# Patient Record
Sex: Female | Born: 1980 | Race: White | Hispanic: No | Marital: Single | Smoking: Former smoker
Health system: Southern US, Community
[De-identification: ages and names within clinical notes are randomized; demographics above are authoritative.]

## PROBLEM LIST (undated history)

## (undated) DIAGNOSIS — F419 Anxiety disorder, unspecified: Secondary | ICD-10-CM

## (undated) DIAGNOSIS — F99 Mental disorder, not otherwise specified: Secondary | ICD-10-CM

## (undated) DIAGNOSIS — I10 Essential (primary) hypertension: Secondary | ICD-10-CM

## (undated) DIAGNOSIS — E282 Polycystic ovarian syndrome: Secondary | ICD-10-CM

## (undated) HISTORY — DX: Mental disorder, not otherwise specified: F99

## (undated) HISTORY — PX: AMPUTATION TOE: SHX6595

## (undated) HISTORY — DX: Polycystic ovarian syndrome: E28.2

---
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
## 2000-04-24 ENCOUNTER — Emergency Department (HOSPITAL_COMMUNITY): Admission: EM | Admit: 2000-04-24 | Discharge: 2000-04-24 | Payer: Self-pay | Admitting: Internal Medicine

## 2001-08-03 ENCOUNTER — Encounter: Payer: Self-pay | Admitting: Emergency Medicine

## 2001-08-03 ENCOUNTER — Emergency Department (HOSPITAL_COMMUNITY): Admission: EM | Admit: 2001-08-03 | Discharge: 2001-08-03 | Payer: Self-pay | Admitting: Emergency Medicine

## 2004-05-21 ENCOUNTER — Emergency Department (HOSPITAL_COMMUNITY): Admission: EM | Admit: 2004-05-21 | Discharge: 2004-05-21 | Payer: Self-pay | Admitting: Emergency Medicine

## 2005-10-28 ENCOUNTER — Emergency Department (HOSPITAL_COMMUNITY): Admission: EM | Admit: 2005-10-28 | Discharge: 2005-10-28 | Payer: Self-pay | Admitting: Emergency Medicine

## 2018-06-15 ENCOUNTER — Telehealth: Payer: Self-pay

## 2018-06-15 ENCOUNTER — Ambulatory Visit
Admission: EM | Admit: 2018-06-15 | Discharge: 2018-06-15 | Disposition: A | Discharge status: Home / Self Care | Payer: Self-pay

## 2018-06-15 DIAGNOSIS — R42 Dizziness and giddiness: Secondary | ICD-10-CM

## 2018-06-15 DIAGNOSIS — I10 Essential (primary) hypertension: Secondary | ICD-10-CM

## 2018-06-15 HISTORY — DX: Anxiety disorder, unspecified: F41.9

## 2018-06-15 HISTORY — DX: Essential (primary) hypertension: I10

## 2018-06-15 LAB — POCT FASTING CBG KUC MANUAL ENTRY: POCT Glucose (KUC): 125 mg/dL — AB (ref 70–99)

## 2018-06-15 MED ORDER — MECLIZINE HCL 25 MG PO TABS: 25.0000 mg | ORAL_TABLET | Freq: Three times a day (TID) | ORAL | 0 refills | Status: DC | PRN

## 2018-06-15 MED ORDER — MECLIZINE HCL 25 MG PO TABS: 25.0000 mg | ORAL_TABLET | Freq: Three times a day (TID) | ORAL | 0 refills | Status: AC | PRN

## 2018-06-15 MED ORDER — LABETALOL HCL 200 MG PO TABS: 200.0000 mg | ORAL_TABLET | Freq: Two times a day (BID) | ORAL | 0 refills | Status: DC

## 2018-06-15 MED ORDER — ESCITALOPRAM OXALATE 20 MG PO TABS: 20.0000 mg | ORAL_TABLET | Freq: Every day | ORAL | 0 refills | Status: DC

## 2018-06-15 NOTE — Discharge Instructions (Signed)
No alarming signs on exam. Start meclizine to help with dizziness/lightheaded with head movement. Restart lexapro. Keep hydrated, urine should be clear to pale yellow in color. Follow up with PCP for further evaluation needed. If experiencing worsening symptoms, chest pain, shortness of breath, passing out, go to the ED for further evaluation needed.

## 2018-06-15 NOTE — ED Triage Notes (Signed)
Pt states been out of her lexapro x2wks, states for the past week "having burst" of feeling light headed.

## 2018-06-15 NOTE — ED Triage Notes (Signed)
Pt states had a anxiety attack on the way here

## 2018-06-15 NOTE — ED Provider Notes (Signed)
[name]    CSN: [id number] Arrival date & time: 06/15/18  1330     History   Chief Complaint Chief Complaint  Patient presents with  . Medication Refill    HPI Teresa Harper is a 38 y.o. female.   38 year old female comes in for evaluation of intermittent lightheadedness.  Patient states for the past 1 to 2 weeks, has had intermittent lightheadedness/dizziness.  This is not associated with activity, positional changes, chest pain, shortness of breath, palpitation, weakness, syncope.  She states today, while driving, had an episode that seems to be worse than the rest, and therefore came in for evaluation.  States it could have been a panic attack as well, as she had hyperventilation, numbness tingling to the forehead, right arm that has now resolved.  She denies fever, chills, night sweats.  Denies URI symptoms such as cough, congestion, sore throat.  Denies one-sided weakness.  Denies history of diabetes, heart disease.  Family history of heart disease, with grandmother having MI, unsure age, but at least over 93.  She has also been off her Lexapro for the past 2 weeks.  She has been on Lexapro 20 mg for "a very long time".  Denies recent changes in dosage.  States was unable to get into primary care, and ran out of medication.     Past Medical History:  Diagnosis Date  . Anxiety   . Hypertension     There are no active problems to display for this patient.   History reviewed. No pertinent surgical history.  OB History   No obstetric history on file.      Home Medications    Prior to Admission medications   Medication Sig Start Date End Date Taking? Authorizing Provider  escitalopram (LEXAPRO) 20 MG tablet Take 1 tablet (20 mg total) by mouth daily. 06/15/18   Cathie Hoops, Amy V, PA-C  labetalol (NORMODYNE) 200 MG tablet Take 1 tablet (200 mg total) by mouth 2 (two) times daily for 30 days. 06/15/18 07/15/18  Belinda Fisher, PA-C  meclizine (ANTIVERT) 25 MG tablet Take 1  tablet (25 mg total) by mouth 3 (three) times daily as needed for dizziness. 06/15/18   Belinda Fisher, PA-C    Family History No family history on file.  Social History Social History   Tobacco Use  . Smoking status: Never Smoker  . Smokeless tobacco: Never Used  Substance Use Topics  . Alcohol use: Yes  . Drug use: Not on file     Allergies   Patient has no known allergies.   Review of Systems Review of Systems  Reason unable to perform ROS: See HPI as above.     Physical Exam Triage Vital Signs ED Triage Vitals  Enc Vitals Group     BP 06/15/18 1350 (!) 146/91     Pulse Rate 06/15/18 1350 78     Resp 06/15/18 1350 20     Temp 06/15/18 1350 98 F (36.7 C)     Temp Source 06/15/18 1350 Oral     SpO2 06/15/18 1350 97 %     Weight --      Height --      Head Circumference --      Peak Flow --      Pain Score 06/15/18 1351 0     Pain Loc --      Pain Edu? --      Excl. in GC? --    No data found.  Updated  Vital Signs BP (!) 146/91 (BP Location: Left Arm)   Pulse 78   Temp 98 F (36.7 C) (Oral)   Resp 20   SpO2 97%   Visual Acuity Right Eye Distance:   Left Eye Distance:   Bilateral Distance:    Right Eye Near:   Left Eye Near:    Bilateral Near:     Physical Exam Constitutional:      General: She is not in acute distress.    Appearance: Normal appearance. She is well-developed. She is not ill-appearing, toxic-appearing or diaphoretic.  HENT:     Head: Normocephalic and atraumatic.     Mouth/Throat:     Mouth: Mucous membranes are moist.     Pharynx: Oropharynx is clear. Uvula midline.  Eyes:     Extraocular Movements: Extraocular movements intact.     Conjunctiva/sclera: Conjunctivae normal.     Pupils: Pupils are equal, round, and reactive to light.  Neck:     Musculoskeletal: Normal range of motion and neck supple.  Cardiovascular:     Rate and Rhythm: Normal rate and regular rhythm.     Heart sounds: Normal heart sounds. No murmur. No  friction rub. No gallop.   Pulmonary:     Effort: Pulmonary effort is normal. No accessory muscle usage, prolonged expiration, respiratory distress or retractions.     Comments: Lungs clear to auscultation without adventitious lung sounds. Skin:    General: Skin is warm and dry.  Neurological:     General: No focal deficit present.     Mental Status: She is alert and oriented to person, place, and time.     GCS: GCS eye subscore is 4. GCS verbal subscore is 5. GCS motor subscore is 6.     Cranial Nerves: Cranial nerves are intact.     Sensory: Sensation is intact.     Motor: Motor function is intact.     Coordination: Coordination is intact.     Gait: Gait is intact.     Comments: Patient able to ambulate on without difficulty.    UC Treatments / Results  Labs (all labs ordered are listed, but only abnormal results are displayed) Labs Reviewed  POCT FASTING CBG [facility] MANUAL ENTRY - Abnormal; Notable for the following components:      Result Value   POCT Glucose ([facility]) 125 (*)    All other components within normal limits    EKG None  Radiology No results found.  Procedures Procedures (including critical care time)  Medications Ordered in UC Medications - No data to display  Initial Impression / Assessment and Plan / UC Course  I have reviewed the triage vital signs and the nursing notes.  Pertinent labs & imaging results that were available during my care of the patient were reviewed by me and considered in my medical decision making (see chart for details).    Patient experiences some dizziness with eye or neck movement.  Otherwise normal exam.  Discussed could be withdrawal from Lexapro, will restart Lexapro as directed.  Will provide meclizine for possible vertigo causing lightheadedness/dizziness.  Reassurance provided.  Will have patient follow-up with PCP for further evaluation and management needed.  Return precautions given.  After discharge, patient requested  refill of labetalol as she has still been unable to get in with primary care, and only has 2 days left of medication.  Will refill 30 days of medication.  Final Clinical Impressions(s) / UC Diagnoses   Final diagnoses:  Episodic lightheadedness  ED Prescriptions    Medication Sig Dispense Auth. Provider   escitalopram (LEXAPRO) 20 MG tablet Take 1 tablet (20 mg total) by mouth daily. 30 tablet Yu, Amy V, PA-C   meclizine (ANTIVERT) 25 MG tablet Take 1 tablet (25 mg total) by mouth 3 (three) times daily as needed for dizziness. 15 tablet Yu, Amy V, PA-C   labetalol (NORMODYNE) 200 MG tablet Take 1 tablet (200 mg total) by mouth 2 (two) times daily for 30 days. 60 tablet Threasa AlphaYu, Amy V, PA-C       Yu, Amy V, [place] 06/15/18 1540

## 2020-03-12 ENCOUNTER — Emergency Department (HOSPITAL_BASED_OUTPATIENT_CLINIC_OR_DEPARTMENT_OTHER)
Admission: EM | Admit: 2020-03-12 | Discharge: 2020-03-12 | Disposition: A | Discharge status: Home / Self Care | Payer: Self-pay | Attending: Emergency Medicine | Admitting: Emergency Medicine

## 2020-03-12 ENCOUNTER — Other Ambulatory Visit: Payer: Self-pay

## 2020-03-12 ENCOUNTER — Emergency Department (HOSPITAL_BASED_OUTPATIENT_CLINIC_OR_DEPARTMENT_OTHER): Payer: Self-pay

## 2020-03-12 ENCOUNTER — Encounter (HOSPITAL_BASED_OUTPATIENT_CLINIC_OR_DEPARTMENT_OTHER): Payer: Self-pay | Admitting: Emergency Medicine

## 2020-03-12 DIAGNOSIS — O23599 Infection of other part of genital tract in pregnancy, unspecified trimester: Secondary | ICD-10-CM | POA: Insufficient documentation

## 2020-03-12 DIAGNOSIS — I10 Essential (primary) hypertension: Secondary | ICD-10-CM | POA: Insufficient documentation

## 2020-03-12 DIAGNOSIS — Z3A00 Weeks of gestation of pregnancy not specified: Secondary | ICD-10-CM | POA: Insufficient documentation

## 2020-03-12 DIAGNOSIS — N939 Abnormal uterine and vaginal bleeding, unspecified: Secondary | ICD-10-CM

## 2020-03-12 DIAGNOSIS — O034 Incomplete spontaneous abortion without complication: Secondary | ICD-10-CM | POA: Insufficient documentation

## 2020-03-12 DIAGNOSIS — B9689 Other specified bacterial agents as the cause of diseases classified elsewhere: Secondary | ICD-10-CM

## 2020-03-12 LAB — COMPREHENSIVE METABOLIC PANEL
ALT: 12 U/L (ref 0–44)
AST: 16 U/L (ref 15–41)
Albumin: 4.1 g/dL (ref 3.5–5.0)
Alkaline Phosphatase: 38 U/L (ref 38–126)
Anion gap: 9 (ref 5–15)
BUN: 7 mg/dL (ref 6–20)
CO2: 25 mmol/L (ref 22–32)
Calcium: 9.5 mg/dL (ref 8.9–10.3)
Chloride: 102 mmol/L (ref 98–111)
Creatinine, Ser: 0.63 mg/dL (ref 0.44–1.00)
GFR, Estimated: >60 mL/min (ref >60)
Glucose, Bld: 93 mg/dL (ref 70–99)
Potassium: 3.5 mmol/L (ref 3.5–5.1)
Sodium: 136 mmol/L (ref 135–145)
Total Bilirubin: 0.5 mg/dL (ref 0.3–1.2)
Total Protein: 7.2 g/dL (ref 6.5–8.1)

## 2020-03-12 LAB — URINALYSIS, ROUTINE W REFLEX MICROSCOPIC
Bilirubin Urine: NEGATIVE
Glucose, UA: NEGATIVE mg/dL
Ketones, ur: NEGATIVE mg/dL
Leukocytes,Ua: NEGATIVE
Nitrite: NEGATIVE
Protein, ur: NEGATIVE mg/dL
Specific Gravity, Urine: 1.02 (ref 1.005–1.030)
pH: 5 (ref 5.0–8.0)

## 2020-03-12 LAB — CBC WITH DIFFERENTIAL/PLATELET
Abs Immature Granulocytes: 0.07 10*3/uL (ref 0.00–0.07)
Basophils Absolute: 0.1 10*3/uL (ref 0.0–0.1)
Basophils Relative: 1 %
Eosinophils Absolute: 0.2 10*3/uL (ref 0.0–0.5)
Eosinophils Relative: 1 %
HCT: 38 % (ref 36.0–46.0)
Hemoglobin: 13.3 g/dL (ref 12.0–15.0)
Immature Granulocytes: 1 %
Lymphocytes Relative: 34 %
Lymphs Abs: 3.7 10*3/uL (ref 0.7–4.0)
MCH: 30 pg (ref 26.0–34.0)
MCHC: 35 g/dL (ref 30.0–36.0)
MCV: 85.6 fL (ref 80.0–100.0)
Monocytes Absolute: 0.9 10*3/uL (ref 0.1–1.0)
Monocytes Relative: 8 %
Neutro Abs: 6.2 10*3/uL (ref 1.7–7.7)
Neutrophils Relative %: 55 %
Platelets: 242 10*3/uL (ref 150–400)
RBC: 4.44 MIL/uL (ref 3.87–5.11)
RDW: 12.2 % (ref 11.5–15.5)
WBC: 11.1 10*3/uL — ABNORMAL HIGH (ref 4.0–10.5)
nRBC: 0 % (ref 0.0–0.2)

## 2020-03-12 LAB — ABO/RH: ABO/RH(D): A POS

## 2020-03-12 LAB — HCG, QUANTITATIVE, PREGNANCY: hCG, Beta Chain, Quant, S: 5972 m[IU]/mL — ABNORMAL HIGH (ref <5)

## 2020-03-12 LAB — WET PREP, GENITAL
Sperm: NONE SEEN
Trich, Wet Prep: NONE SEEN
Yeast Wet Prep HPF POC: NONE SEEN

## 2020-03-12 LAB — PREGNANCY, URINE: Preg Test, Ur: POSITIVE — AB

## 2020-03-12 LAB — URINALYSIS, MICROSCOPIC (REFLEX)

## 2020-03-12 MED ORDER — METRONIDAZOLE 500 MG PO TABS: 500.0000 mg | ORAL_TABLET | Freq: Two times a day (BID) | ORAL | 0 refills | Status: AC

## 2020-03-12 NOTE — ED Notes (Signed)
Patient transported to Ultrasound 

## 2020-03-12 NOTE — ED Provider Notes (Signed)
Emergency Department Provider Note   I have reviewed the triage vital signs and the nursing notes.   HISTORY  Chief Complaint Vaginal Bleeding   HPI Teresa Harper is a 40 y.o. female with past medical history of anxiety and hypertension presents to the emergency department with vaginal bleeding in the setting of at home positive pregnancy test.  Patient has never been pregnant previously.  She tested positive for pregnancy 3 times at home.  She states that she had sex on Friday with some faint spotting at that time.  She has had several instances in the past 24 hours with some passage of small clots.  She notes that today she has been wearing a pad with minimal bleeding.  She notes sometimes she will have some pink-tinged material with wiping.  Denies any dysuria, hesitancy, urgency.  She has called to establish care with an OB/GYN but appointments are several weeks away at least and she was advised to present to the ED with vaginal bleeding.  She is not having abdominal pain.  She does describe a "fullness" in the lower abdomen. No prior pregnancy or miscarriage.    Past Medical History:  Diagnosis Date  . Anxiety   . Hypertension   . Mental disorder     Patient Active Problem List   Diagnosis Date Noted  . Threatened abortion 03/14/2020    History reviewed. No pertinent surgical history.  Allergies Patient has no known allergies.  No family history on file.  Social History Social History   Tobacco Use  . Smoking status: Never Smoker  . Smokeless tobacco: Never Used  Substance Use Topics  . Alcohol use: Yes  . Drug use: Never    Review of Systems  Constitutional: No fever/chills Eyes: No visual changes. ENT: No sore throat. Cardiovascular: Denies chest pain. Respiratory: Denies shortness of breath. Gastrointestinal: No abdominal pain.  No nausea, no vomiting.  No diarrhea.  No constipation. Genitourinary: Negative for dysuria. Positive vaginal bleeding with  positive home pregnancy test.  Musculoskeletal: Negative for back pain. Skin: Negative for rash. Neurological: Negative for headaches, focal weakness or numbness.  10-point ROS otherwise negative.  ____________________________________________   PHYSICAL EXAM:  VITAL SIGNS: ED Triage Vitals  Enc Vitals Group     BP 03/12/20 1500 (!) 149/100     Pulse Rate 03/12/20 1500 78     Resp 03/12/20 1500 18     Temp 03/12/20 1500 98.9 F (37.2 C)     Temp Source 03/12/20 1500 Oral     SpO2 03/12/20 1500 100 %     Weight 03/12/20 1501 220 lb (99.8 kg)     Height 03/12/20 1501 5\' 10"  (1.778 m)   Constitutional: Alert and oriented. Well appearing and in no acute distress. Eyes: Conjunctivae are normal.  Head: Atraumatic. Nose: No congestion/rhinnorhea. Mouth/Throat: Mucous membranes are moist.   Neck: No stridor.  Cardiovascular: Normal rate, regular rhythm. Good peripheral circulation. Grossly normal heart sounds.   Respiratory: Normal respiratory effort.  No retractions. Lungs CTAB. Gastrointestinal: Soft and nontender. No distention.  GU: Exam performed with patient's verbal consent and nurse tech chaperone.  No vaginal bleeding appreciated in the vault.  The cervix is visually closed.  Trace blood noted adjacent to the cervix without injury or laceration.  Musculoskeletal: No lower extremity tenderness nor edema. No gross deformities of extremities. Neurologic:  Normal speech and language. No gross focal neurologic deficits are appreciated.  Skin:  Skin is warm, dry and intact. No rash  noted.   ____________________________________________   LABS (all labs ordered are listed, but only abnormal results are displayed)  Labs Reviewed  WET PREP, GENITAL - Abnormal; Notable for the following components:      Result Value   Clue Cells Wet Prep HPF POC PRESENT (*)    WBC, Wet Prep HPF POC MODERATE (*)    All other components within normal limits  CBC WITH DIFFERENTIAL/PLATELET -  Abnormal; Notable for the following components:   WBC 11.1 (*)    All other components within normal limits  PREGNANCY, URINE - Abnormal; Notable for the following components:   Preg Test, Ur POSITIVE (*)    All other components within normal limits  URINALYSIS, ROUTINE W REFLEX MICROSCOPIC - Abnormal; Notable for the following components:   Hgb urine dipstick SMALL (*)    All other components within normal limits  HCG, QUANTITATIVE, PREGNANCY - Abnormal; Notable for the following components:   hCG, Beta Chain, Quant, S 5,972 (*)    All other components within normal limits  URINALYSIS, MICROSCOPIC (REFLEX) - Abnormal; Notable for the following components:   Bacteria, UA FEW (*)    All other components within normal limits  COMPREHENSIVE METABOLIC PANEL  ABO/RH  GC/CHLAMYDIA PROBE AMP ([place]) NOT AT [facility]   ____________________________________________  RADIOLOGY  Pelvic US consistent with failed pregnancy.  ____________________________________________   PROCEDURES  Procedure(s) performed:   Procedures  None ____________________________________________   INITIAL IMPRESSION / ASSESSMENT AND PLAN / ED COURSE  Pertinent labs & imaging results that were available during my care of the patient were reviewed by me and considered in my medical decision making (see chart for details).   Patient presents to the emergency department with vaginal bleeding with positive home pregnancy test.  She has never been pregnant in the past.  Her vital signs are largely unremarkable other than mildly elevated blood pressure with known history of hypertension currently medicated.  She is not having headache or vision change.  Vaginal bleeding is fairly minimal by report.  Plan for ABO Rh, labs, UA.   [place] consistent with failed pregnancy. A+ blood type. Discussed with OB. Plan for expectant mgmt and OB will coordinate with patient for an appointment later this week. Plan to treat BV. Discussed  expectant mgmt plan and ED return precautions.  ____________________________________________  FINAL CLINICAL IMPRESSION(S) / ED DIAGNOSES  Final diagnoses:  Vaginal bleeding  Incomplete miscarriage  BV (bacterial vaginosis)    NEW OUTPATIENT MEDICATIONS STARTED DURING THIS VISIT:  Discharge Medication List as of 03/12/2020  6:30 PM    START taking these medications   Details  metroNIDAZOLE (FLAGYL) 500 MG tablet Take 1 tablet (500 mg total) by mouth 2 (two) times daily for 7 days., Starting Tue 03/12/2020, Until Tue 03/19/2020, Normal        Note:  This document was prepared using Dragon voice recognition software and may include unintentional dictation errors.  Alona Bene, MD, [facility]    Arilyn Brierley, Arlyss Repress, MD 03/21/20 [phone]

## 2020-03-12 NOTE — Discharge Instructions (Signed)
You were seen in the emergency department today with vaginal bleeding.  Unfortunately, you do have a pregnancy in the uterus but there is no heartbeat which has resulted in a miscarriage.  This will likely lead to additional bleeding and possibly some cramping and passage of clots over the next several days.  I have reached out to the OB/GYN team at [facility] and they will be calling you tomorrow to set up the follow-up appointment later this week.   If you develop severe pain, bleeding, fevers you should return to the emergency department. I am so sorry for your loss.

## 2020-03-12 NOTE — ED Notes (Signed)
PT not in room for vitals

## 2020-03-12 NOTE — ED Triage Notes (Signed)
Reports having a positive pregnancy test on Friday.  Did not think she could get pregnant.  Endorses small amount of bleeding since yesterday.  Wanted to make sure everything is okay.

## 2020-03-13 LAB — GC/CHLAMYDIA PROBE AMP (~~LOC~~) NOT AT ARMC
Chlamydia: NEGATIVE
Comment: NEGATIVE
Comment: NORMAL
Neisseria Gonorrhea: NEGATIVE

## 2020-03-14 ENCOUNTER — Encounter: Payer: Self-pay | Admitting: Obstetrics and Gynecology

## 2020-03-14 ENCOUNTER — Other Ambulatory Visit: Payer: Self-pay

## 2020-03-14 ENCOUNTER — Ambulatory Visit (INDEPENDENT_AMBULATORY_CARE_PROVIDER_SITE_OTHER): Payer: Self-pay | Admitting: Obstetrics and Gynecology

## 2020-03-14 ENCOUNTER — Telehealth: Payer: Self-pay | Admitting: *Deleted

## 2020-03-14 DIAGNOSIS — O200 Threatened abortion: Secondary | ICD-10-CM | POA: Insufficient documentation

## 2020-03-14 NOTE — Telephone Encounter (Signed)
Called patient to notify of ultrasound scheduled for 03/21/20 , left message of appointment date/ time/ location. Rusell Meneely,RN

## 2020-03-14 NOTE — Progress Notes (Signed)
   Subjective:    Patient ID: Teresa Harper, female    DOB: January 02, 1981, 40 y.o.   MRN: [id number]  HPI 40 yo G1P0 seen at [facility] for follow up of possible miscarriage.  Chart was reviewed in detail with attention to ultrasound report.  Discrepancy noted.  One portion of report said fetal heart motion was visualized while the impression states the pregnancy was nonviable.  Unable to have definitive proof with bedside ultrasound.  Findings were relayed to the patient and she agrees to repeat u/s for viability.   Review of Systems     Objective:   Physical Exam Vitals:   03/14/20 0958  BP: 127/87  Pulse: 75   CLINICAL DATA:  Vaginal bleeding   EXAM: OBSTETRIC <14 WK [place] AND TRANSVAGINAL OB US   TECHNIQUE: Both transabdominal and transvaginal ultrasound examinations were performed for complete evaluation of the gestation as well as the maternal uterus, adnexal regions, and pelvic cul-de-sac. Transvaginal technique was performed to assess early pregnancy.   COMPARISON:  None.   FINDINGS: Intrauterine gestational sac: Single intrauterine gestational sac   Yolk sac:  Not seen   Embryo:  Visualized   Cardiac Activity: Visualized   CRL: 14.5 mm   7 w   5 d                  [place] EDC: 10/24/2020   Subchorionic hemorrhage:  None visualized.   Maternal uterus/adnexae: Left ovary is nonvisualized. Right ovary is within normal limits and measures 4 x 2 x 2.8 cm. No significant free fluid.   IMPRESSION: Single intrauterine pregnancy with visualized embryo with crown-rump length of 14.5 mm but no fetal cardiac activity. Findings meet definitive criteria for failed pregnancy. This follows SRU consensus guidelines: Diagnostic Criteria for Nonviable Pregnancy Early in the First Trimester. [facility] [phone].           Assessment & Plan:   1. Threatened abortion Unsure of definitive diagnosis due to ultrasound report.  Will get rescan for viability as soon  as possible.  Pt desires expectant management if fetus is not viable.  Would follow up pt in 1-2 weeks if no spontaneous miscarriage for discussion of D and C if needed.  I spent 15 minutes dedicated to the care of this patient including previsit review of records, face to face time with the patient discussing current diagnosis, plan and post visit testing.   - US OB Transvaginal; Future    Warden Fillers, MD Faculty Attending, [facility]

## 2020-03-14 NOTE — Patient Instructions (Signed)
Incomplete Miscarriage An incomplete miscarriage happens when tissue from pregnancy or part of the placenta, known as products of conception, remain in the body after a miscarriage. A miscarriage is the loss of pregnancy before the 20th week. Most miscarriages happen in the first 3 months of pregnancy. Sometimes, a miscarriage happens before a woman knows she is pregnant. Having a miscarriage can be an emotional experience. If you have had a miscarriage, talk with your health care provider about any questions you may have about:  The loss of your baby.  The grieving process.  Your future pregnancy plans. What are the causes? Many times, the cause of an incomplete miscarriage is not known. What increases the risk? The following factors may make a pregnant woman more likely to have an incomplete miscarriage:  Using a watch-and-wait approach (expectant management) to treat a miscarriage.  Using medicine to treat a miscarriage. What are the signs or symptoms? Symptoms of this condition include:  Vaginal bleeding or spotting, with or without cramps or pain.  Pain or cramping in the abdomen or lower back.  Fluid or tissue coming out of the vagina. How is this diagnosed? This condition may be diagnosed based on:  A physical exam.  Ultrasound. How is this treated? An incomplete miscarriage may be treated with:  Dilation and curettage (D&C). In this procedure, the cervix is stretched open and any remaining pregnancy tissue is removed from the lining of the uterus (endometrium). The cervix is the lowest part of the uterus, which opens into the vagina.  Medicines. These may include: ? Antibiotic medicine, to treat infection. ? Medicine to help any remaining pregnancy tissue come out of the uterus. ? Medicine to reduce (contract) the size of the uterus. These medicines may be given if there is a lot of bleeding. If you have Rh-negative blood, you may be given an injection of Rho(D) immune  globulin to help prevent problems with future pregnancies. Follow these instructions at home: Medicines  Take over-the-counter and prescription medicines only as told by your health care provider.  If you were prescribed antibiotic medicine, take your antibiotic as told by your health care provider. Do not stop taking the antibiotic even if you start to feel better. Activity  Rest as told by your health care provider. Ask your health care provider what activities are safe for you.  Have someone help with home and family responsibilities during this time. General instructions  Monitor how much tissue or blood comes out of the vagina.  Do not have sex, douche, or put anything in your vagina, such as tampons, until your health care provider says it is okay.  To help you and your partner with the grieving process, talk with your health care provider or get counseling to help deal with the pregnancy loss.  When you are ready, meet with your health care provider to discuss any important steps you should take for your health. Also, discuss steps you should take to have a healthy pregnancy in the future.  Keep all follow-up visits. This is important.   Where to find more information  The American College of Obstetricians and Gynecologists: acog.org  [place] Department of Health and Human Services Office of Women's Health: hrsa.gov/office-womens-health Contact a health care provider if:  You have a fever or chills.  There is bad-smelling fluid coming from your vagina.  You have more bleeding instead of less.  Tissue or blood clots come out of your vagina. Get help right away if:  You   have severe cramps or pain in your back or abdomen.  Heavy bleeding soaks through 2 large sanitary pads an hour for more than 2 hours.  You become light-headed or weak.  You faint.  You feel sad, and your sadness takes over your thoughts.  You think about hurting yourself. If you ever feel like you  may hurt yourself or others, or have thoughts about taking your own life, get help right away. Go to your nearest emergency department or:  Call your local emergency services (911 in the [place]  Call a suicide crisis helpline, such as the National Suicide Prevention Lifeline at 1-800-273-8255. This is open 24 hours a day in the [place]  Text the Crisis Text Line at 741741 (in the [place] Summary  An incomplete miscarriage happens when tissue from pregnancy or part of the placenta, known as products of conception, remain in the body after a miscarriage.  Treatment may include a dilation and curettage (D&C) procedure or medicines. In a D&C procedure, tissue is removed from the uterus.  Rest as told by your health care provider. Ask your health care provider what activities are safe for you.  To help you and your partner with the grieving process, talk with your health care provider or get counseling to help deal with the pregnancy loss. This information is not intended to replace advice given to you by your health care provider. Make sure you discuss any questions you have with your health care provider. Document Revised: 06/30/2019 Document Reviewed: 06/30/2019 Elsevier Patient Education  2021 [employer]vier Inc.  

## 2020-03-21 ENCOUNTER — Ambulatory Visit
Admission: RE | Admit: 2020-03-21 | Discharge: 2020-03-21 | Disposition: A | Discharge status: Home / Self Care | Payer: Medicaid Other | Source: Ambulatory Visit | Attending: Obstetrics and Gynecology | Admitting: Obstetrics and Gynecology

## 2020-03-21 ENCOUNTER — Encounter: Payer: Self-pay | Admitting: Student

## 2020-03-21 ENCOUNTER — Ambulatory Visit (INDEPENDENT_AMBULATORY_CARE_PROVIDER_SITE_OTHER): Payer: Self-pay | Admitting: Student

## 2020-03-21 ENCOUNTER — Other Ambulatory Visit: Payer: Self-pay

## 2020-03-21 VITALS — BP 129/90 | HR 87

## 2020-03-21 DIAGNOSIS — O200 Threatened abortion: Secondary | ICD-10-CM | POA: Insufficient documentation

## 2020-03-21 DIAGNOSIS — Z1231 Encounter for screening mammogram for malignant neoplasm of breast: Secondary | ICD-10-CM

## 2020-03-21 DIAGNOSIS — O039 Complete or unspecified spontaneous abortion without complication: Secondary | ICD-10-CM | POA: Insufficient documentation

## 2020-03-21 NOTE — Progress Notes (Signed)
°  History:  [name] is a 40 y.o. G1P0010 who presents to clinic today for follow up for miscarriage. She reports that she passed the fetus and sac yesterday. Bleeding has slowed down. Patient has POC  in specimen bag. She denies fever, pain, nausea, vomiting, diarrhea or other complaints.   She was diagnosed with PCOS a long time ago but has not had any kind of work-up for it.  The following portions of the patient's history were reviewed and updated as appropriate: allergies, current medications, family history, past medical history, social history, past surgical history and problem list.  Review of Systems:  Review of Systems  Constitutional: Negative.   HENT: Negative.   Eyes: Negative.   Respiratory: Negative.   Cardiovascular: Negative.   Skin: Negative.   Neurological: Negative.       Objective:  Physical Exam BP 129/90    Pulse 87    Breastfeeding No  Physical Exam Constitutional:      Appearance: Normal appearance.  Musculoskeletal:        General: Normal range of motion.  Skin:    General: Skin is warm and dry.  Neurological:     General: No focal deficit present.     Mental Status: She is alert.  Psychiatric:        Mood and Affect: Mood normal.       Labs and Imaging No results found for this or any previous visit (from the past 24 hour(s)).  US OB Transvaginal  Result Date: 03/21/2020 CLINICAL DATA:  Viability scan, discrepancy in previous ultrasound, recent bleeding and passage of tissue EXAM: OBSTETRIC <14 WK ULTRASOUND TECHNIQUE: Transabdominal ultrasound was performed for evaluation of the gestation as well as the maternal uterus and adnexal regions. COMPARISON:  Ob ultrasound March 12, 2020 FINDINGS: Intrauterine gestational sac: None Yolk sac:  Not Visualized. Embryo:  Not Visualized. Cardiac Activity: Not visualized. Subchorionic hemorrhage:  None visualized. Maternal uterus/adnexae: Endometrial thickness of 21 mm. IMPRESSION: There is no longer  a gestational sac or embryo visualized within the uterus, both of which were present on prior imaging. Findings meet definitive criteria for failed pregnancy. This follows SRU consensus guidelines: Diagnostic Criteria for Nonviable Pregnancy Early in the first Trimester. [facility] [phone]. Electronically Signed   By: Maudry Mayhew MD   On: 03/21/2020 16:05     Assessment & Plan:   1. Encounter for screening mammogram for breast cancer   2. Miscarriage    -Patient does not know if she wants to try to get get pregnant again; her boyfriend works out of town and they have not talked about it. She declines birth control today.  -patient can come back for pap smear and to talk about PCOS with MD. --will order mammo as patient has not had mammo; patient has number to call for BCCCP  Approximately 30  minutes of total time was spent with this patient on counseling and coordination of care   Marylene Land, CNM 03/22/2020 9:53 AM

## 2020-03-21 NOTE — Patient Instructions (Addendum)
-  come to [facility] if you develop fever, foul-smelling discharge, abdominal tenderness or bleed through two thick pads an hour for two hours, come back to [facility]. Expect some dark brown/red discharge for the next few weeks.

## 2020-03-25 ENCOUNTER — Telehealth: Payer: Self-pay

## 2020-03-25 ENCOUNTER — Other Ambulatory Visit: Payer: Medicaid Other

## 2020-03-25 ENCOUNTER — Other Ambulatory Visit: Payer: Self-pay

## 2020-03-25 DIAGNOSIS — O039 Complete or unspecified spontaneous abortion without complication: Secondary | ICD-10-CM

## 2020-03-25 NOTE — Telephone Encounter (Addendum)
-----   Message from Warden Fillers, MD sent at 03/25/2020  3:16 PM EDT ----- Ultrasound showed completed miscarriage , no IUP or POC noted recommend  weekly bhcg until normal   Called pt; results and provider recommendation given. Pt states she would like to come now for repeat beta. Pt added to schedule.

## 2020-03-26 ENCOUNTER — Telehealth: Payer: Self-pay

## 2020-03-26 LAB — BETA HCG QUANT (REF LAB): hCG Quant: 47 m[IU]/mL

## 2020-03-26 NOTE — Telephone Encounter (Signed)
-----   Message from Warden Fillers, MD sent at 03/26/2020  8:24 AM EDT ----- Bhcg decreased to 47, repeat in 1-2 weeks

## 2020-03-26 NOTE — Telephone Encounter (Signed)
Called Pt to advise of BHCG test results of 47 & that she will need to be re-tested in 1-2 weeks. Advised some one will be contacting her to schedule appointment. Pt verbalized understanding.

## 2020-03-29 ENCOUNTER — Encounter: Payer: Self-pay | Admitting: Lactation Services

## 2020-04-03 ENCOUNTER — Ambulatory Visit (INDEPENDENT_AMBULATORY_CARE_PROVIDER_SITE_OTHER): Payer: Self-pay

## 2020-04-03 ENCOUNTER — Other Ambulatory Visit: Payer: Self-pay

## 2020-04-03 VITALS — BP 128/85 | HR 61 | Wt 226.4 lb

## 2020-04-03 DIAGNOSIS — O039 Complete or unspecified spontaneous abortion without complication: Secondary | ICD-10-CM

## 2020-04-03 NOTE — Progress Notes (Signed)
Pt here today for follow-up beta HCG per Donavan Foil, MD; currently trending beta HCG down after miscarriage. Pt has also requested Anora miscarriage testing. Labs drawn for beta HCG and Anora. Products of conception were brought to visit by patient to be sent for Anora test. Per pt, products were passed on 04-16-2020 and have been stored in saline in refrigerator until today. All specimens packaged according to Anora instructions. Results will be routed to Donavan Foil, MD for follow-up recommendations. Pt aware Albertina Senegal results may take up to 2 weeks.  Fleet Contras RN 04/03/20

## 2020-04-04 LAB — BETA HCG QUANT (REF LAB): hCG Quant: 13 m[IU]/mL

## 2020-04-04 NOTE — Progress Notes (Signed)
Patient was assessed and managed by nursing staff during this encounter. I have reviewed the chart and agree with the documentation and plan. I have also made any necessary editorial changes.  I was not aware the patient was going to save the products of conception for testing.  In the future, for firtst trimester losses thhis may not be an expedient procedure.  Warden Fillers, MD 04/04/2020 8:09 AM

## 2020-04-15 ENCOUNTER — Other Ambulatory Visit: Payer: Self-pay | Admitting: Lactation Services

## 2020-04-15 DIAGNOSIS — O039 Complete or unspecified spontaneous abortion without complication: Secondary | ICD-10-CM

## 2020-04-18 ENCOUNTER — Encounter: Payer: Self-pay | Admitting: *Deleted

## 2020-04-19 ENCOUNTER — Other Ambulatory Visit (HOSPITAL_COMMUNITY)
Admission: RE | Admit: 2020-04-19 | Discharge: 2020-04-19 | Disposition: A | Discharge status: Home / Self Care | Payer: Medicaid Other | Source: Ambulatory Visit | Attending: Obstetrics and Gynecology | Admitting: Obstetrics and Gynecology

## 2020-04-19 ENCOUNTER — Encounter: Payer: Self-pay | Admitting: Obstetrics and Gynecology

## 2020-04-19 ENCOUNTER — Ambulatory Visit (INDEPENDENT_AMBULATORY_CARE_PROVIDER_SITE_OTHER): Payer: Medicaid Other | Admitting: Obstetrics and Gynecology

## 2020-04-19 ENCOUNTER — Other Ambulatory Visit: Payer: Medicaid Other

## 2020-04-19 ENCOUNTER — Other Ambulatory Visit: Payer: Self-pay

## 2020-04-19 VITALS — BP 141/89 | HR 74 | Wt 231.4 lb

## 2020-04-19 DIAGNOSIS — Z5189 Encounter for other specified aftercare: Secondary | ICD-10-CM

## 2020-04-19 DIAGNOSIS — Z01419 Encounter for gynecological examination (general) (routine) without abnormal findings: Secondary | ICD-10-CM | POA: Insufficient documentation

## 2020-04-19 DIAGNOSIS — Z3009 Encounter for other general counseling and advice on contraception: Secondary | ICD-10-CM | POA: Diagnosis not present

## 2020-04-19 DIAGNOSIS — O039 Complete or unspecified spontaneous abortion without complication: Secondary | ICD-10-CM

## 2020-04-19 NOTE — Progress Notes (Signed)
Obstetrics and Gynecology New Patient Evaluation  Appointment Date: 04/19/2020  OBGYN Clinic: Center for [facility]  Chief Complaint: Annual exam  History of Present Illness: Teresa Harper is a 40 y.o. Caucasian G1P0010 (No LMP recorded.), seen for the above chief complaint. Her past medical history is significant for recent miscarriage in march, BMI 30s, HTN, PCOS, anxiety/depression    Review of Systems: A comprehensive review of systems was negative.   Past Medical History:  Past Medical History:  Diagnosis Date  . Anxiety   . Hypertension   . Mental disorder     Past Surgical History:  Past Surgical History:  Procedure Laterality Date  . AMPUTATION TOE Left     Past Obstetrical History:  OB History  Gravida Para Term Preterm AB Living  1 0 0 0 1 0  SAB IAB Ectopic Multiple Live Births  1 0 0 0 0    # Outcome Date GA Lbr Len/2nd Weight Sex Delivery Anes PTL Lv  1 SAB 03/2020 [redacted]w[redacted]d           Past Gynecological History: As per HPI. Periods: qmonth, regular, not heavy or painful History of Pap Smear(s): unknown She is currently using no method for contraception.   Social History:  Social History   Socioeconomic History  . Marital status: Single    Spouse name: Not on file  . Number of children: Not on file  . Years of education: Not on file  . Highest education level: Not on file  Occupational History  . Not on file  Tobacco Use  . Smoking status: Never Smoker  . Smokeless tobacco: Never Used  Substance and Sexual Activity  . Alcohol use: Yes  . Drug use: Never  . Sexual activity: Not on file  Other Topics Concern  . Not on file  Social History Narrative  . Not on file   Social Determinants of Health   Financial Resource Strain: Not on file  Food Insecurity: No Food Insecurity  . Worried About Programme researcher, broadcasting/film/video in the Last Year: Never true  . Ran Out of Food in the Last Year: Never true  Transportation Needs: No  Transportation Needs  . Lack of Transportation (Medical): No  . Lack of Transportation (Non-Medical): No  Physical Activity: Not on file  Stress: Not on file  Social Connections: Not on file  Intimate Partner Violence: Not on file    Family History: She denies any female cancers  Medications Teresa Harper had no medications administered during this visit. Current Outpatient Medications  Medication Sig Dispense Refill  . Cholecalciferol (VITAMIN D3) 10 MCG (400 UNIT) CAPS Take by mouth.    . cyclobenzaprine (FLEXERIL) 10 MG tablet     . escitalopram (LEXAPRO) 20 MG tablet Take 1 tablet (20 mg total) by mouth daily. 30 tablet 0  . vitamin B-12 (CYANOCOBALAMIN) 500 MCG tablet Take 500 mcg by mouth daily.    Marland Kitchen HYDROcodone-acetaminophen (NORCO) 7.5-325 MG tablet  (Patient not taking: No sig reported)    . labetalol (NORMODYNE) 200 MG tablet Take 1 tablet (200 mg total) by mouth 2 (two) times daily for 30 days. 60 tablet 0  . meclizine (ANTIVERT) 25 MG tablet Take 1 tablet (25 mg total) by mouth 3 (three) times daily as needed for dizziness. (Patient not taking: No sig reported) 15 tablet 0   No current facility-administered medications for this visit.    Allergies Patient has no known allergies.   Physical Exam:  BP Marland Kitchen)  141/89   Pulse 74   Wt 231 lb 6.4 oz (105 kg)   BMI 33.20 kg/m  Body mass index is 33.2 kg/m. General appearance: Well nourished, well developed female in no acute distress.  Neck:  Supple, normal appearance, and no thyromegaly  Cardiovascular: normal s1 and s2.  No murmurs, rubs or gallops. Respiratory:  Clear to auscultation bilateral. Normal respiratory effort Abdomen: positive bowel sounds and no masses, hernias; diffusely non tender to palpation, non distended Breasts: breasts appear normal, no suspicious masses, no skin or nipple changes or axillary nodes, and normal palpation. Neuro/Psych:  Normal mood and affect.  Skin:  Warm and dry.  Lymphatic:  No  inguinal lymphadenopathy.   Pelvic exam: is not limited by body habitus EGBUS: within normal limits Vagina: within normal limits and with no blood or discharge in the vault Cervix: normal appearing cervix without tenderness, discharge or lesions. Uterus:  nonenlarged and non tender Adnexa:  normal adnexa and no mass, fullness, tenderness Rectovaginal: deferred  Laboratory: none  Radiology: none  Assessment: pt doing well  Plan:  1. Well woman exam with routine gynecological exam Routine care. Patient scheduled for mammogram on 5/6. See below - Cytology - PAP( [place])  2. Contraception She does not want to take birth control and is not actively trying to get pregnant, but she would be happy if she did. Does not want STD testing today. Does not remember when her last pap smear was, states her last one was normal. Denies vaginal discharge and abdominal pain.   3. 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 I told her this was likely due to AMA, which increases at her age but doesn't mean that next cycle would cause another miscarriage, so it's okay to try. I told her I recommend waiting until next cycle before trying again. I recommend she continue folic acid. Her boyfriend lives at a [employer], and she has not seen him in a month. She states that she has close support still here in the area. Labetalol and lexapro are safe for pregnancy.   4. PCOS She states that she has a history of irregular menses since menarche. She notes having a period 1-2x a year. She has previously been on OCP's to regulate her cycles but has not been on birth control in a while. She states in 2019 her periods have regulated themselves naturally and now gets a period every month  5. PCP Follow up with for subsequent care  RTC PRN  Cornelia Copa MD Attending Center for [facility]

## 2020-04-23 LAB — CYTOLOGY - PAP
Comment: NEGATIVE
Diagnosis: NEGATIVE
High risk HPV: NEGATIVE

## 2020-04-25 ENCOUNTER — Encounter: Payer: Self-pay | Admitting: Obstetrics and Gynecology

## 2020-04-30 ENCOUNTER — Encounter: Payer: Self-pay | Admitting: General Practice

## 2020-05-17 ENCOUNTER — Other Ambulatory Visit: Payer: Self-pay

## 2020-05-17 ENCOUNTER — Ambulatory Visit
Admission: RE | Admit: 2020-05-17 | Discharge: 2020-05-17 | Disposition: A | Discharge status: Home / Self Care | Payer: Medicaid Other | Source: Ambulatory Visit | Attending: Student | Admitting: Student

## 2020-05-17 DIAGNOSIS — Z1231 Encounter for screening mammogram for malignant neoplasm of breast: Secondary | ICD-10-CM

## 2020-05-20 ENCOUNTER — Other Ambulatory Visit: Payer: Self-pay | Admitting: Student

## 2020-05-20 ENCOUNTER — Other Ambulatory Visit: Payer: Self-pay

## 2020-05-20 DIAGNOSIS — R928 Other abnormal and inconclusive findings on diagnostic imaging of breast: Secondary | ICD-10-CM

## 2020-06-11 ENCOUNTER — Ambulatory Visit: Payer: Medicaid Other

## 2020-06-11 ENCOUNTER — Other Ambulatory Visit: Payer: Medicaid Other

## 2020-06-11 ENCOUNTER — Ambulatory Visit: Payer: Medicaid Other | Admitting: *Deleted

## 2020-06-11 ENCOUNTER — Other Ambulatory Visit: Payer: Self-pay

## 2020-06-11 ENCOUNTER — Ambulatory Visit
Admission: RE | Admit: 2020-06-11 | Discharge: 2020-06-11 | Disposition: A | Discharge status: Home / Self Care | Payer: No Typology Code available for payment source | Source: Ambulatory Visit | Attending: Obstetrics and Gynecology | Admitting: Obstetrics and Gynecology

## 2020-06-11 ENCOUNTER — Other Ambulatory Visit: Payer: Self-pay | Admitting: Obstetrics and Gynecology

## 2020-06-11 VITALS — BP 130/82 | Wt 238.0 lb

## 2020-06-11 DIAGNOSIS — R928 Other abnormal and inconclusive findings on diagnostic imaging of breast: Secondary | ICD-10-CM

## 2020-06-11 DIAGNOSIS — Z1239 Encounter for other screening for malignant neoplasm of breast: Secondary | ICD-10-CM

## 2020-06-11 NOTE — Patient Instructions (Signed)
Explained breast self awareness with Eldridge Dace. Patient did not need a Pap smear today due to last Pap smear and HPV typing was 04/19/2020. Let her know BCCCP will cover Pap smears and HPV typing every 5 years unless has a history of abnormal Pap smears. Referred patient to the [facility] for a left breast diagnostic mammogram per recommendation. Appointment scheduled Tuesday, Jun 11, 2020 at 1020. Patient aware of appointment and will be there. Teresa Harper verbalized understanding.  Logan Baltimore, Kathaleen Maser, RN 9:22 AM

## 2020-06-11 NOTE — Progress Notes (Signed)
Teresa Harper is a 40 y.o. female who presents to [facility] clinic today with no complaints. Patient referred to [facility] by the [facility] due to recommending additional imaging of her left breast. Screening mammogram completed 05/17/2020.    Pap Smear: Pap smear not completed today. Last Pap smear was 04/19/2020 at the [facility] and was normal with negative HPV. Per patient has no history of an abnormal Pap smear. Last Pap smear result is available in Epic.   Physical exam: Breasts Breasts symmetrical. No skin abnormalities bilateral breasts. No nipple retraction bilateral breasts. No nipple discharge bilateral breasts. No lymphadenopathy. No lumps palpated bilateral breasts. No complaints of pain or tenderness on exam.  MM 3D SCREEN BREAST BILATERAL  Result Date: 05/17/2020 CLINICAL DATA:  Screening. EXAM: DIGITAL SCREENING BILATERAL MAMMOGRAM WITH TOMOSYNTHESIS AND CAD TECHNIQUE: Bilateral screening digital craniocaudal and mediolateral oblique mammograms were obtained. Bilateral screening digital breast tomosynthesis was performed. The images were evaluated with computer-aided detection. COMPARISON:  None. ACR Breast Density Category b: There are scattered areas of fibroglandular density. FINDINGS: In the left breast, a possible mass warrants further evaluation. This possible mass is seen within the outer LEFT breast, cc slice 56 and MLO slice 64. In the right breast, no findings suspicious for malignancy. IMPRESSION: Further evaluation is suggested for possible mass in the left breast. RECOMMENDATION: Ultrasound of the left breast. (Code:US-L-67M) The patient will be contacted regarding the findings, and additional imaging will be scheduled. BI-RADS CATEGORY  0: Incomplete. Need additional imaging evaluation and/or prior mammograms for comparison. Electronically Signed   By: Bary Richard M.D.   On: 05/17/2020 15:37      Pelvic/Bimanual Pap is not  indicated today per BCCCP guidelines.   Smoking History: Patient is a former smoker that quit in 2006.   Patient Navigation: Patient education provided. Access to services provided for patient through BCCCP program.    Breast and Cervical Cancer Risk Assessment: Patient does not have family history of breast cancer, known genetic mutations, or radiation treatment to the chest before age 48. Patient does not have history of cervical dysplasia, immunocompromised, or DES exposure in-utero.  Risk Assessment    Risk Scores      06/11/2020   Last edited by: Meryl Dare, CMA   5-year risk: 0.6 %   Lifetime risk: 10.2 %         A: BCCCP exam without pap smear No complaints.  P: Referred patient to the [facility] for a left breast diagnostic mammogram per recommendation. Appointment scheduled Tuesday, Jun 11, 2020 at 1020.  Priscille Heidelberg, RN 06/11/2020 9:22 AM

## 2020-12-12 ENCOUNTER — Other Ambulatory Visit: Payer: Self-pay | Admitting: Obstetrics and Gynecology

## 2020-12-12 ENCOUNTER — Ambulatory Visit
Admission: RE | Admit: 2020-12-12 | Discharge: 2020-12-12 | Disposition: A | Discharge status: Home / Self Care | Payer: No Typology Code available for payment source | Source: Ambulatory Visit | Attending: Obstetrics and Gynecology | Admitting: Obstetrics and Gynecology

## 2020-12-12 DIAGNOSIS — R928 Other abnormal and inconclusive findings on diagnostic imaging of breast: Secondary | ICD-10-CM

## 2021-06-12 ENCOUNTER — Other Ambulatory Visit: Payer: No Typology Code available for payment source

## 2021-06-17 ENCOUNTER — Encounter: Payer: Self-pay | Admitting: Nurse Practitioner

## 2021-06-17 ENCOUNTER — Ambulatory Visit (INDEPENDENT_AMBULATORY_CARE_PROVIDER_SITE_OTHER): Payer: No Typology Code available for payment source | Admitting: Nurse Practitioner

## 2021-06-17 VITALS — BP 126/83 | HR 69 | Temp 97.1°F | Ht 70.08 in | Wt 238.0 lb

## 2021-06-17 DIAGNOSIS — J301 Allergic rhinitis due to pollen: Secondary | ICD-10-CM

## 2021-06-17 DIAGNOSIS — I10 Essential (primary) hypertension: Secondary | ICD-10-CM

## 2021-06-17 DIAGNOSIS — Z7689 Persons encountering health services in other specified circumstances: Secondary | ICD-10-CM

## 2021-06-17 DIAGNOSIS — Z6834 Body mass index (BMI) 34.0-34.9, adult: Secondary | ICD-10-CM

## 2021-06-17 DIAGNOSIS — F411 Generalized anxiety disorder: Secondary | ICD-10-CM

## 2021-06-17 MED ORDER — ESCITALOPRAM OXALATE 20 MG PO TABS: 20.0000 mg | ORAL_TABLET | Freq: Every day | ORAL | 5 refills | Status: DC

## 2021-06-17 MED ORDER — LABETALOL HCL 200 MG PO TABS: 200.0000 mg | ORAL_TABLET | Freq: Two times a day (BID) | ORAL | 1 refills | Status: DC

## 2021-06-17 MED ORDER — BUSPIRONE HCL 10 MG PO TABS: ORAL_TABLET | ORAL | 1 refills | Status: DC

## 2021-06-17 MED ORDER — LABETALOL HCL 200 MG PO TABS: 200.0000 mg | ORAL_TABLET | Freq: Two times a day (BID) | ORAL | 5 refills | Status: DC

## 2021-06-17 MED ORDER — FLUTICASONE PROPIONATE 50 MCG/ACT NA SUSP: 2.0000 | Freq: Every day | NASAL | 6 refills | Status: AC

## 2021-06-17 NOTE — Progress Notes (Signed)
New Patient Office Visit  Subjective    Patient ID: Teresa Harper, female    DOB: 03-15-1980  Age: 41 y.o. MRN: [id number]  CC:  Chief Complaint  Patient presents with   New Patient (Initial Visit)    HPI CARNISHA RAIKES presents to establish care The patient is coming from different local provider who has recently retired.  -having issues with allergies and sneezing. States that she has been taking cetirizine daily. Still having some congestion and sneezing.  -she has noted that her blood pressure has been running elevated. Currently taking labetalol 200mg  daily. Has been prescribed this twice daily in the past. Today, blood pressure is good, however, she just took medication at noon.  -having anxiety problems. Living situation is stressful. Primary caretaker for her mom. Living with her dad. [place] by itself isn't helping.  -scheduled to have a 6 month repeat mammogram next week. Did have first mammogram last year and there was something seen which needed to have close follow up.    Outpatient Encounter Medications as of 06/17/2021  Medication Sig   busPIRone (BUSPAR) 10 MG tablet Take 1/2 to 1 tablet po Bid as needed for acute anxiety   fluticasone (FLONASE) 50 MCG/ACT nasal spray Place 2 sprays into both nostrils daily.   Cholecalciferol (VITAMIN D3) 10 MCG (400 UNIT) CAPS Take by mouth.   cyclobenzaprine (FLEXERIL) 10 MG tablet  (Patient not taking: Reported on 06/11/2020)   escitalopram (LEXAPRO) 20 MG tablet Take 1 tablet (20 mg total) by mouth daily.   HYDROcodone-acetaminophen (NORCO) 7.5-325 MG tablet  (Patient not taking: Reported on 04/03/2020)   labetalol (NORMODYNE) 200 MG tablet Take 1 tablet (200 mg total) by mouth 2 (two) times daily.   meclizine (ANTIVERT) 25 MG tablet Take 1 tablet (25 mg total) by mouth 3 (three) times daily as needed for dizziness. (Patient not taking: Reported on 03/21/2020)   Melatonin 10 MG CAPS Take by mouth.   meloxicam (MOBIC) 15 MG tablet  Take 15 mg by mouth daily. (Patient not taking: Reported on 06/11/2020)   Prenatal Vit-Fe Fumarate-FA (PRENATAL MULTIVITAMIN) TABS tablet Take 1 tablet by mouth daily at 12 noon.   vitamin B-12 (CYANOCOBALAMIN) 500 MCG tablet Take 500 mcg by mouth daily.   [DISCONTINUED] escitalopram (LEXAPRO) 20 MG tablet Take 1 tablet (20 mg total) by mouth daily.   [DISCONTINUED] labetalol (NORMODYNE) 200 MG tablet Take 1 tablet (200 mg total) by mouth 2 (two) times daily for 30 days.   [DISCONTINUED] labetalol (NORMODYNE) 200 MG tablet Take 1 tablet (200 mg total) by mouth 2 (two) times daily.   No facility-administered encounter medications on file as of 06/17/2021.    Past Medical History:  Diagnosis Date   Anxiety    Hypertension    Mental disorder    PCOS (polycystic ovarian syndrome)     Past Surgical History:  Procedure Laterality Date   AMPUTATION TOE Left     Family History  Problem Relation Age of Onset   Hypertension Mother    Hypertension Father    Diabetes Father    Diabetes Sister    Hypertension Brother    Diabetes Brother     Social History   Socioeconomic History   Marital status: Single    Spouse name: Not on file   Number of children: Not on file   Years of education: Not on file   Highest education level: Associate degree: occupational, Hotel manager, or vocational program  Occupational History   Not  on file  Tobacco Use   Smoking status: Former    Types: Cigarettes    Quit date: 2006    Years since quitting: 17.4   Smokeless tobacco: Never  Vaping Use   Vaping Use: Never used  Substance and Sexual Activity   Alcohol use: Yes    Comment: occ.   Drug use: Never   Sexual activity: Yes    Birth control/protection: None  Other Topics Concern   Not on file  Social History Narrative   Not on file   Social Determinants of Health   Financial Resource Strain: Not on file  Food Insecurity: No Food Insecurity (06/19/2021)   Hunger Vital Sign    Worried About  Running Out of Food in the Last Year: Never true    Ran Out of Food in the Last Year: Never true  Transportation Needs: No Transportation Needs (06/19/2021)   PRAPARE - Hydrologist (Medical): No    Lack of Transportation (Non-Medical): No  Physical Activity: Not on file  Stress: Not on file  Social Connections: Not on file  Intimate Partner Violence: Not on file    Review of Systems  Constitutional:  Negative for chills, fever and malaise/fatigue.  HENT:  Positive for congestion. Negative for sinus pain and sore throat.        Sneezing  Eyes: Negative.   Respiratory:  Negative for cough, shortness of breath and wheezing.   Cardiovascular:  Negative for chest pain, palpitations and leg swelling.       Recently elevated blood pressure   Gastrointestinal:  Negative for constipation, diarrhea, nausea and vomiting.  Genitourinary: Negative.   Musculoskeletal:  Negative for myalgias.  Skin: Negative.   Neurological:  Negative for dizziness and headaches.  Endo/Heme/Allergies:  Does not bruise/bleed easily.  Psychiatric/Behavioral:  Positive for depression. The patient is nervous/anxious.         Objective    Today's Vitals   06/17/21 1400  BP: 126/83  Pulse: 69  Temp: (!) 97.1 F (36.2 C)  SpO2: 97%  Weight: 238 lb (108 kg)  Height: 5' 10.08" (1.78 m)   Body mass index is 34.07 kg/m.   Physical Exam Vitals and nursing note reviewed.  Constitutional:      Appearance: Normal appearance. She is well-developed. She is obese.  HENT:     Head: Normocephalic and atraumatic.     Nose: Nose normal.     Mouth/Throat:     Mouth: Mucous membranes are moist.     Pharynx: Oropharynx is clear.  Eyes:     Extraocular Movements: Extraocular movements intact.     Conjunctiva/sclera: Conjunctivae normal.     Pupils: Pupils are equal, round, and reactive to light.  Cardiovascular:     Rate and Rhythm: Normal rate and regular rhythm.     Pulses: Normal  pulses.     Heart sounds: Normal heart sounds.  Pulmonary:     Effort: Pulmonary effort is normal.     Breath sounds: Normal breath sounds.  Abdominal:     Palpations: Abdomen is soft.  Musculoskeletal:        General: Normal range of motion.     Cervical back: Normal range of motion and neck supple.  Lymphadenopathy:     Cervical: No cervical adenopathy.  Skin:    General: Skin is warm and dry.     Capillary Refill: Capillary refill takes less than 2 seconds.  Neurological:     General: No  focal deficit present.     Mental Status: She is alert and oriented to person, place, and time.  Psychiatric:        Mood and Affect: Mood normal.        Behavior: Behavior normal.        Thought Content: Thought content normal.        Judgment: Judgment normal.      Assessment & Plan:  1. Essential hypertension Currently blood pressure stable.  Continue labetalol 200 mg twice daily.  Discussed DASH diet.  Increase water every day.  Reassess in 6 weeks. - labetalol (NORMODYNE) 200 MG tablet; Take 1 tablet (200 mg total) by mouth 2 (two) times daily.  Dispense: 180 tablet; Refill: 1  2. Generalized anxiety disorder Increase escitalopram to 20 mg daily.  Add BuSpar 10 mg.  Take 1/2 to 1 tablet twice daily as needed for acute anxiety.  Reassess in 6 weeks. - busPIRone (BUSPAR) 10 MG tablet; Take 1/2 to 1 tablet po Bid as needed for acute anxiety  Dispense: 60 tablet; Refill: 1 - escitalopram (LEXAPRO) 20 MG tablet; Take 1 tablet (20 mg total) by mouth daily.  Dispense: 30 tablet; Refill: 5  3. Seasonal allergic rhinitis due to pollen Add fluticasone nasal spray.  Use 2 sprays in both nostrils daily.  Continue cetirizine 10 mg daily. - fluticasone (FLONASE) 50 MCG/ACT nasal spray; Place 2 sprays into both nostrils daily.  Dispense: 16 g; Refill: 6  4. BMI 34.0-34.9,adult Discussed lowering calorie intake to 1500 calories per day and incorporating exercise into daily routine to help lose  weight.   5. Encounter to establish care Appointment today to establish new primary care provider      Problem List Items Addressed This Visit       Cardiovascular and Mediastinum   Essential hypertension - Primary   Relevant Medications   labetalol (NORMODYNE) 200 MG tablet     Respiratory   Seasonal allergic rhinitis due to pollen   Relevant Medications   fluticasone (FLONASE) 50 MCG/ACT nasal spray     Other   Generalized anxiety disorder   Relevant Medications   busPIRone (BUSPAR) 10 MG tablet   escitalopram (LEXAPRO) 20 MG tablet   BMI 34.0-34.9,adult   Other Visit Diagnoses     Encounter to establish care           Return in about 6 weeks (around 07/29/2021) for health maintenance exam, with pap - can we make this 30 minute appointment .   Ronnell Freshwater, NP

## 2021-06-19 ENCOUNTER — Ambulatory Visit: Payer: Self-pay | Admitting: *Deleted

## 2021-06-19 ENCOUNTER — Ambulatory Visit
Admission: RE | Admit: 2021-06-19 | Discharge: 2021-06-19 | Disposition: A | Discharge status: Home / Self Care | Payer: No Typology Code available for payment source | Source: Ambulatory Visit | Attending: Obstetrics and Gynecology | Admitting: Obstetrics and Gynecology

## 2021-06-19 VITALS — BP 128/86 | Wt 281.9 lb

## 2021-06-19 DIAGNOSIS — R928 Other abnormal and inconclusive findings on diagnostic imaging of breast: Secondary | ICD-10-CM

## 2021-06-19 DIAGNOSIS — M79622 Pain in left upper arm: Secondary | ICD-10-CM

## 2021-06-19 DIAGNOSIS — Z1239 Encounter for other screening for malignant neoplasm of breast: Secondary | ICD-10-CM

## 2021-06-19 NOTE — Progress Notes (Signed)
[name] is a 41 y.o. female who presents to [facility] clinic today with complaint of left axillary lump for around 3 months. Patient had a left breast ultrasound completed 12/12/2020 that a six month bilateral diagnostic mammogram and left breast ultrasound were recommended for follow up.    Pap Smear: Pap smear not completed today. Last Pap smear was 04/19/2020 at the [facility] and was normal with negative HPV. Per patient has no history of an abnormal Pap smear. Last Pap smear result is available in Epic.   Physical exam: Breasts Breasts symmetrical. No skin abnormalities bilateral breasts. No nipple retraction bilateral breasts. No nipple discharge bilateral breasts. No lymphadenopathy. No lumps palpated bilateral breasts. Unable to palpate a lump in patients area of concern within the left axilla. Complaints of left axillary tenderness on exam.  MM 3D SCREEN BREAST BILATERAL  Result Date: 05/17/2020 CLINICAL DATA:  Screening. EXAM: DIGITAL SCREENING BILATERAL MAMMOGRAM WITH TOMOSYNTHESIS AND CAD TECHNIQUE: Bilateral screening digital craniocaudal and mediolateral oblique mammograms were obtained. Bilateral screening digital breast tomosynthesis was performed. The images were evaluated with computer-aided detection. COMPARISON:  None. ACR Breast Density Category b: There are scattered areas of fibroglandular density. FINDINGS: In the left breast, a possible mass warrants further evaluation. This possible mass is seen within the outer LEFT breast, cc slice 56 and MLO slice 64. In the right breast, no findings suspicious for malignancy. IMPRESSION: Further evaluation is suggested for possible mass in the left breast. RECOMMENDATION: Ultrasound of the left breast. (Code:US-L-56M) The patient will be contacted regarding the findings, and additional imaging will be scheduled. BI-RADS CATEGORY  0: Incomplete. Need additional imaging evaluation and/or prior mammograms  for comparison. Electronically Signed   By: Bary Richard M.D.   On: 05/17/2020 15:37        Pelvic/Bimanual Pap is not indicated today per BCCCP guidelines.   Smoking History: Patient is a former smoker that quit in 2006.   Patient Navigation: Patient education provided. Access to services provided for patient through BCCCP program.    Breast and Cervical Cancer Risk Assessment: Patient does not have family history of breast cancer, known genetic mutations, or radiation treatment to the chest before age 79. Patient does not have history of cervical dysplasia, immunocompromised, or DES exposure in-utero.  Risk Assessment     Risk Scores       06/19/2021 06/11/2020   Last edited by: Narda Rutherford, LPN Meryl Dare, CMA   5-year risk: 0.6 % 0.6 %   Lifetime risk: 10.1 % 10.2 %            A: BCCCP exam without pap smear Complaint of left axillary lump.  P: Referred patient to the [facility] for a diagnostic mammogram per recommendation. Appointment scheduled Thursday, June 19, 2021 at 1030.  Priscille Heidelberg, RN 06/19/2021 9:22 AM

## 2021-06-19 NOTE — Patient Instructions (Signed)
Explained breast self awareness with Teresa Harper. Patient did not need a Pap smear today due to last Pap smear and HPV typing was 04/19/2020. Let her know BCCCP will cover Pap smears and HPV typing every 5 years unless has a history of abnormal Pap smears. Referred patient to the [facility] for a diagnostic mammogram per recommendation. Appointment scheduled Thursday, June 19, 2021 at 1030. Patient aware of appointment and will be there.  Teresa Harper verbalized understanding.  Rannie Craney, Kathaleen Maser, RN 9:23 AM

## 2021-06-22 NOTE — Progress Notes (Signed)
Stable and benign appearing ultrasound of left breast. Repeat in one year

## 2021-06-22 NOTE — Telephone Encounter (Signed)
Can you get results for this so we can update her chart? We may have to call to get results sent to [place]. Thanks so much.   -HB

## 2021-06-23 NOTE — Telephone Encounter (Signed)
Called [facility] office closed early for lunch

## 2021-06-24 NOTE — Telephone Encounter (Signed)
I sent a Medical Record Request for this patient

## 2021-06-29 DIAGNOSIS — I10 Essential (primary) hypertension: Secondary | ICD-10-CM | POA: Insufficient documentation

## 2021-06-29 DIAGNOSIS — Z6834 Body mass index (BMI) 34.0-34.9, adult: Secondary | ICD-10-CM | POA: Insufficient documentation

## 2021-06-29 DIAGNOSIS — J301 Allergic rhinitis due to pollen: Secondary | ICD-10-CM | POA: Insufficient documentation

## 2021-06-29 DIAGNOSIS — F411 Generalized anxiety disorder: Secondary | ICD-10-CM | POA: Insufficient documentation

## 2021-07-29 ENCOUNTER — Encounter: Payer: No Typology Code available for payment source | Admitting: Nurse Practitioner

## 2021-08-28 ENCOUNTER — Other Ambulatory Visit: Payer: Self-pay | Admitting: Nurse Practitioner

## 2021-08-28 DIAGNOSIS — F411 Generalized anxiety disorder: Secondary | ICD-10-CM

## 2022-01-18 ENCOUNTER — Ambulatory Visit
Admission: EM | Admit: 2022-01-18 | Discharge: 2022-01-18 | Disposition: A | Discharge status: Home / Self Care | Payer: No Typology Code available for payment source | Attending: Nurse Practitioner | Admitting: Nurse Practitioner

## 2022-01-18 ENCOUNTER — Encounter: Payer: Self-pay | Admitting: Emergency Medicine

## 2022-01-18 ENCOUNTER — Other Ambulatory Visit: Payer: Self-pay

## 2022-01-18 DIAGNOSIS — M7750 Other enthesopathy of unspecified foot: Secondary | ICD-10-CM

## 2022-01-18 MED ORDER — PREDNISONE 20 MG PO TABS: 40.0000 mg | ORAL_TABLET | Freq: Every day | ORAL | 0 refills | Status: AC

## 2022-01-18 NOTE — Discharge Instructions (Signed)
Prednisone daily for 5 days Rest, elevate, ice to the foot as needed Ace wrap for support Follow-up with your PCP in 2 to 3 days for recheck Please go to the emergency room if you have any worsening symptoms

## 2022-01-18 NOTE — ED Triage Notes (Signed)
Pt here for left foot pain x 3 days; denies obvious injury

## 2022-01-18 NOTE — ED Provider Notes (Signed)
[name]    CSN: [id number] Arrival date & time: 01/18/22  1533      History   Chief Complaint Chief Complaint  Patient presents with   Foot Pain    HPI Teresa Harper is a 42 y.o. female who presents for evaluation of an injury to her left foot x 2 day(s).  Patient denies any known injury or inciting event.  She reports tenderness and an aching type pain to the mid dorsum of her left foot.  States it feels swollen but denies any bruising, numbness/tingling.  No history of surgeries to the left foot with the exception of a left great toe amputation years ago.  She has been using ice, Tylenol, and an old prescription of hydrocodone with minimal relief.  No other concerns at this time.   Foot Pain    Past Medical History:  Diagnosis Date   Anxiety    Hypertension    Mental disorder    PCOS (polycystic ovarian syndrome)     Patient Active Problem List   Diagnosis Date Noted   Essential hypertension 06/29/2021   Generalized anxiety disorder 06/29/2021   Seasonal allergic rhinitis due to pollen 06/29/2021   BMI 34.0-34.9,adult 06/29/2021    Past Surgical History:  Procedure Laterality Date   AMPUTATION TOE Left     OB History     Gravida  1   Para  0   Term  0   Preterm  0   AB  1   Living  0      SAB  1   IAB  0   Ectopic  0   Multiple  0   Live Births  0            Home Medications    Prior to Admission medications   Medication Sig Start Date End Date Taking? Authorizing Provider  predniSONE (DELTASONE) 20 MG tablet Take 2 tablets (40 mg total) by mouth daily with breakfast for 5 days. 01/18/22 01/23/22 Yes Radford Pax, NP  busPIRone (BUSPAR) 10 MG tablet TAKE 1/2 TO 1 TABLET BY MOUTH TWICE A DAY AS NEEDED FOR ACUTE ANXIETY 08/29/21   Carlean Jews, NP  Cholecalciferol (VITAMIN D3) 10 MCG (400 UNIT) CAPS Take by mouth.    [provider]  cyclobenzaprine (FLEXERIL) 10 MG tablet  11/09/19   [provider]  escitalopram (LEXAPRO) 20 MG tablet Take 1 tablet (20 mg total) by mouth daily. 06/17/21   Carlean Jews, NP  fluticasone (FLONASE) 50 MCG/ACT nasal spray Place 2 sprays into both nostrils daily. 06/17/21   Carlean Jews, NP  HYDROcodone-acetaminophen (NORCO) 7.5-325 MG tablet  11/09/19   [provider]  labetalol (NORMODYNE) 200 MG tablet Take 1 tablet (200 mg total) by mouth 2 (two) times daily. 06/17/21   Carlean Jews, NP  meclizine (ANTIVERT) 25 MG tablet Take 1 tablet (25 mg total) by mouth 3 (three) times daily as needed for dizziness. Patient not taking: Reported on 03/21/2020 06/15/18   Belinda Fisher, PA-C  Melatonin 10 MG CAPS Take by mouth.    [provider]  meloxicam (MOBIC) 15 MG tablet Take 15 mg by mouth daily. Patient not taking: Reported on 06/11/2020    [provider]  Prenatal Vit-Fe Fumarate-FA (PRENATAL MULTIVITAMIN) TABS tablet Take 1 tablet by mouth daily at 12 noon.    [provider]  vitamin B-12 (CYANOCOBALAMIN) 500 MCG tablet Take 500 mcg by mouth daily.  [provider]    Family History Family History  Problem Relation Age of Onset   Hypertension Mother    Hypertension Father    Diabetes Father    Diabetes Sister    Hypertension Brother    Diabetes Brother     Social History Social History   Tobacco Use   Smoking status: Former    Types: Cigarettes    Quit date: 2006    Years since quitting: 18.0   Smokeless tobacco: Never  Vaping Use   Vaping Use: Never used  Substance Use Topics   Alcohol use: Yes    Comment: occ.   Drug use: Never     Allergies   Patient has no known allergies.   Review of Systems Review of Systems  Musculoskeletal:        Left foot pain     Physical Exam Triage Vital Signs ED Triage Vitals [01/18/22 1540]  Enc Vitals Group     BP (!) 164/105     Pulse Rate 96     Resp 18     Temp 98.3 F (36.8 C)     Temp Source Oral     SpO2 97 %      Weight      Height      Head Circumference      Peak Flow      Pain Score 7     Pain Loc      Pain Edu?      Excl. in [place]?    No data found.  Updated Vital Signs BP (!) 164/105 (BP Location: Left Arm)   Pulse 96   Temp 98.3 F (36.8 C) (Oral)   Resp 18   SpO2 97%   Visual Acuity Right Eye Distance:   Left Eye Distance:   Bilateral Distance:    Right Eye Near:   Left Eye Near:    Bilateral Near:     Physical Exam Vitals and nursing note reviewed.  Constitutional:      Appearance: Normal appearance.  HENT:     Head: Normocephalic and atraumatic.  Eyes:     Pupils: Pupils are equal, round, and reactive to light.  Cardiovascular:     Rate and Rhythm: Normal rate.  Pulmonary:     Effort: Pulmonary effort is normal.  Musculoskeletal:       Feet:  Skin:    General: Skin is warm and dry.  Neurological:     General: No focal deficit present.     Mental Status: She is alert and oriented to person, place, and time.  Psychiatric:        Mood and Affect: Mood normal.        Behavior: Behavior normal.      UC Treatments / Results  Labs (all labs ordered are listed, but only abnormal results are displayed) Labs Reviewed - No data to display  EKG   Radiology No results found.  Procedures Procedures (including critical care time)  Medications Ordered in UC Medications - No data to display  Initial Impression / Assessment and Plan / UC Course  I have reviewed the triage vital signs and the nursing notes.  Pertinent labs & imaging results that were available during my care of the patient were reviewed by me and considered in my medical decision making (see chart for details).     Reviewed exam and symptoms with patient.  No red flags on exam.  No injury.  Discussed likely tendinitis/strain RICE  therapy.  Ace wrap provided but patient did not want placed while in clinic Prednisone x 5 days Advised follow-up PCP 2 to 3 days for recheck ER precautions  reviewed and patient verbalized understanding Final Clinical Impressions(s) / UC Diagnoses   Final diagnoses:  Tendonitis of foot     Discharge Instructions      Prednisone daily for 5 days Rest, elevate, ice to the foot as needed Ace wrap for support Follow-up with your PCP in 2 to 3 days for recheck Please go to the emergency room if you have any worsening symptoms   ED Prescriptions     Medication Sig Dispense Auth. Provider   predniSONE (DELTASONE) 20 MG tablet Take 2 tablets (40 mg total) by mouth daily with breakfast for 5 days. 10 tablet Radford Pax, NP      PDMP not reviewed this encounter.   Radford Pax, NP 01/18/22 [phone]

## 2022-02-07 ENCOUNTER — Other Ambulatory Visit: Payer: Self-pay | Admitting: Nurse Practitioner

## 2022-02-07 DIAGNOSIS — F411 Generalized anxiety disorder: Secondary | ICD-10-CM

## 2022-03-30 ENCOUNTER — Other Ambulatory Visit: Payer: Self-pay | Admitting: Nurse Practitioner

## 2022-03-30 DIAGNOSIS — F411 Generalized anxiety disorder: Secondary | ICD-10-CM

## 2022-04-21 ENCOUNTER — Other Ambulatory Visit: Payer: Self-pay

## 2022-04-21 ENCOUNTER — Telehealth: Payer: Self-pay | Admitting: *Deleted

## 2022-04-21 DIAGNOSIS — F411 Generalized anxiety disorder: Secondary | ICD-10-CM

## 2022-04-21 MED ORDER — BUSPIRONE HCL 10 MG PO TABS: ORAL_TABLET | ORAL | 0 refills | Status: DC

## 2022-04-21 MED ORDER — ESCITALOPRAM OXALATE 20 MG PO TABS: 20.0000 mg | ORAL_TABLET | Freq: Every day | ORAL | 0 refills | Status: DC

## 2022-04-21 NOTE — Telephone Encounter (Signed)
Rx's were  filled up until her appt. 04/29/2022 to PD

## 2022-04-21 NOTE — Telephone Encounter (Signed)
Pt calling to schedule appointment for med refill and wanted to see if she could get enough sent in to last until her appointment.      escitalopram (LEXAPRO) 20 MG tablet   busPIRone (BUSPAR) 10 MG tablet     [facility] LOV 06/17/21  ROV 04/29/22

## 2022-04-29 ENCOUNTER — Encounter: Payer: Self-pay | Admitting: Nurse Practitioner

## 2022-04-29 ENCOUNTER — Ambulatory Visit (INDEPENDENT_AMBULATORY_CARE_PROVIDER_SITE_OTHER): Payer: Self-pay | Admitting: Nurse Practitioner

## 2022-04-29 VITALS — BP 136/87 | HR 73 | Ht 70.8 in | Wt 287.0 lb

## 2022-04-29 DIAGNOSIS — F411 Generalized anxiety disorder: Secondary | ICD-10-CM

## 2022-04-29 DIAGNOSIS — F331 Major depressive disorder, recurrent, moderate: Secondary | ICD-10-CM

## 2022-04-29 DIAGNOSIS — E66813 Obesity, class 3: Secondary | ICD-10-CM

## 2022-04-29 DIAGNOSIS — I10 Essential (primary) hypertension: Secondary | ICD-10-CM

## 2022-04-29 MED ORDER — LABETALOL HCL 200 MG PO TABS: 200.0000 mg | ORAL_TABLET | Freq: Two times a day (BID) | ORAL | 1 refills | Status: DC

## 2022-04-29 MED ORDER — ESCITALOPRAM OXALATE 20 MG PO TABS: 20.0000 mg | ORAL_TABLET | Freq: Every day | ORAL | 3 refills | Status: DC

## 2022-04-29 MED ORDER — BUSPIRONE HCL 10 MG PO TABS: ORAL_TABLET | ORAL | 3 refills | Status: AC

## 2022-04-29 MED ORDER — BUPROPION HCL ER (XL) 150 MG PO TB24: 150.0000 mg | ORAL_TABLET | Freq: Every day | ORAL | 3 refills | Status: DC

## 2022-04-29 NOTE — Progress Notes (Signed)
Established patient visit   Patient: Teresa Harper   DOB: October 16, 1980   42 y.o. Female  MRN: [id number] Visit Date: 04/29/2022   Chief Complaint  Patient presents with   Medical Management of Chronic Issues   Subjective    HPI  Follow up  -dad passed away 2022/04/13.  -increased anxiety/depression since this death -lack of energy -having a difficult time with grief -history of hypertension  --generally well controlled  -needs refills for labetalol  She denies chest pain, chest pressure, or shortness of breath. She denies headaches or visual disturbances. She denies abdominal pain, nausea, vomiting, or changes in bowel or bladder habits.     Medications: Outpatient Medications Prior to Visit  Medication Sig   Cholecalciferol (VITAMIN D3) 10 MCG (400 UNIT) CAPS Take by mouth.   cyclobenzaprine (FLEXERIL) 10 MG tablet  (Patient not taking: Reported on 06/11/2020)   fluticasone (FLONASE) 50 MCG/ACT nasal spray Place 2 sprays into both nostrils daily.   HYDROcodone-acetaminophen (NORCO) 7.5-325 MG tablet  (Patient not taking: Reported on 04/03/2020)   meclizine (ANTIVERT) 25 MG tablet Take 1 tablet (25 mg total) by mouth 3 (three) times daily as needed for dizziness. (Patient not taking: Reported on 03/21/2020)   Melatonin 10 MG CAPS Take by mouth.   meloxicam (MOBIC) 15 MG tablet Take 15 mg by mouth daily. (Patient not taking: Reported on 06/11/2020)   Prenatal Vit-Fe Fumarate-FA (PRENATAL MULTIVITAMIN) TABS tablet Take 1 tablet by mouth daily at 12 noon.   vitamin B-12 (CYANOCOBALAMIN) 500 MCG tablet Take 500 mcg by mouth daily.   [DISCONTINUED] busPIRone (BUSPAR) 10 MG tablet TAKE 1/2 TO 1 TABLET BY MOUTH TWICE A DAY AS NEEDED FOR ACUTE ANXIETY   [DISCONTINUED] escitalopram (LEXAPRO) 20 MG tablet Take 1 tablet (20 mg total) by mouth daily.   [DISCONTINUED] labetalol (NORMODYNE) 200 MG tablet Take 1 tablet (200 mg total) by mouth 2 (two) times daily.   No facility-administered  medications prior to visit.    Review of Systems See HPI    Last CBC Lab Results  Component Value Date   WBC 11.1 (H) 03/12/2020   HGB 13.3 03/12/2020   HCT 38.0 03/12/2020   MCV 85.6 03/12/2020   MCH 30.0 03/12/2020   RDW 12.2 03/12/2020   PLT 242 03/12/2020   Last metabolic panel Lab Results  Component Value Date   GLUCOSE 93 03/12/2020   NA 136 03/12/2020   K 3.5 03/12/2020   CL 102 03/12/2020   CO2 25 03/12/2020   BUN 7 03/12/2020   CREATININE 0.63 03/12/2020   GFRNONAA >60 03/12/2020   CALCIUM 9.5 03/12/2020   PROT 7.2 03/12/2020   ALBUMIN 4.1 03/12/2020   BILITOT 0.5 03/12/2020   ALKPHOS 38 03/12/2020   AST 16 03/12/2020   ALT 12 03/12/2020   ANIONGAP 9 03/12/2020        Objective     Today's Vitals   04/29/22 1036 04/29/22 1145  BP: (Abnormal) 141/98 136/87  Pulse: 73   SpO2: 99%   Weight: 287 lb (130.2 kg)   Height: 5' 10.8" (1.798 m)    Body mass index is 40.25 kg/m.  BP Readings from Last 3 Encounters:  04/29/22 136/87  01/18/22 (Abnormal) 164/105  06/19/21 128/86    Wt Readings from Last 3 Encounters:  04/29/22 287 lb (130.2 kg)  06/19/21 281 lb 14.4 oz (127.9 kg)  06/17/21 238 lb (108 kg)    Physical Exam Vitals and nursing note reviewed.  Constitutional:  Appearance: Normal appearance. She is well-developed. She is obese.  HENT:     Head: Normocephalic and atraumatic.     Nose: Nose normal.     Mouth/Throat:     Mouth: Mucous membranes are moist.     Pharynx: Oropharynx is clear.  Eyes:     Extraocular Movements: Extraocular movements intact.     Conjunctiva/sclera: Conjunctivae normal.     Pupils: Pupils are equal, round, and reactive to light.  Neck:     Vascular: No carotid bruit.  Cardiovascular:     Rate and Rhythm: Normal rate and regular rhythm.     Pulses: Normal pulses.     Heart sounds: Normal heart sounds.  Pulmonary:     Effort: Pulmonary effort is normal.     Breath sounds: Normal breath sounds.   Abdominal:     Palpations: Abdomen is soft.  Musculoskeletal:        General: Normal range of motion.     Cervical back: Normal range of motion and neck supple.  Lymphadenopathy:     Cervical: No cervical adenopathy.  Skin:    General: Skin is warm and dry.     Capillary Refill: Capillary refill takes less than 2 seconds.  Neurological:     General: No focal deficit present.     Mental Status: She is alert and oriented to person, place, and time.  Psychiatric:        Attention and Perception: Attention and perception normal.        Mood and Affect: Affect normal. Mood is anxious.        Speech: Speech normal.        Behavior: Behavior normal. Behavior is cooperative.        Thought Content: Thought content normal.        Cognition and Memory: Cognition and memory normal.        Judgment: Judgment normal.     Assessment & Plan    Essential hypertension Assessment & Plan: Generally well controlled  Continue labetalol 200 mg twice daily  Follow DASH diet  Monitor closely   Orders: -     Labetalol HCl; Take 1 tablet (200 mg total) by mouth 2 (two) times daily.  Dispense: 180 tablet; Refill: 1  Moderate episode of recurrent major depressive disorder [facility]) Assessment & Plan: Add wellbutrin XL 150 mg daily  Reassess in 3-4 months for further evaluation   Orders: -     buPROPion HCl ER (XL); Take 1 tablet (150 mg total) by mouth daily.  Dispense: 30 tablet; Refill: 3  Generalized anxiety disorder Assessment & Plan: Continue lexapro 20 mg daily  Use buspirone twice daily as needed for acute anxiety   Orders: -     busPIRone HCl; TAKE 1/2 TO 1 TABLET BY MOUTH TWICE A DAY AS NEEDED FOR ACUTE ANXIETY  Dispense: 60 tablet; Refill: 3 -     Escitalopram Oxalate; Take 1 tablet (20 mg total) by mouth daily.  Dispense: 30 tablet; Refill: 3 -     buPROPion HCl ER (XL); Take 1 tablet (150 mg total) by mouth daily.  Dispense: 30 tablet; Refill: 3  Class 3 severe obesity due to  excess calories with serious comorbidity and body mass index (BMI) of 40.0 to 44.9 in adult [facility]) Assessment & Plan: Discussed lowering calorie intake to 1500 calories per day and incorporating exercise into daily routine to help lose weight.       Return in about 3 months (around 07/29/2022)  for mood. 3-4 months .         Carlean Jews, NP  [facility] [phone] (phone) [phone] (fax)  [facility]

## 2022-05-18 ENCOUNTER — Other Ambulatory Visit: Payer: Self-pay

## 2022-05-18 DIAGNOSIS — N6012 Diffuse cystic mastopathy of left breast: Secondary | ICD-10-CM

## 2022-05-27 DIAGNOSIS — F331 Major depressive disorder, recurrent, moderate: Secondary | ICD-10-CM | POA: Insufficient documentation

## 2022-05-27 NOTE — Assessment & Plan Note (Signed)
Discussed lowering calorie intake to 1500 calories per day and incorporating exercise into daily routine to help lose weight.  °

## 2022-05-27 NOTE — Assessment & Plan Note (Signed)
Continue lexapro 20 mg daily  Use buspirone twice daily as needed for acute anxiety

## 2022-05-27 NOTE — Assessment & Plan Note (Signed)
Add wellbutrin XL 150 mg daily  Reassess in 3-4 months for further evaluation

## 2022-05-27 NOTE — Assessment & Plan Note (Signed)
Generally well controlled  Continue labetalol 200 mg twice daily  Follow DASH diet  Monitor closely

## 2022-06-25 ENCOUNTER — Other Ambulatory Visit: Payer: Self-pay

## 2022-06-25 ENCOUNTER — Ambulatory Visit: Payer: Self-pay

## 2022-07-02 ENCOUNTER — Ambulatory Visit: Payer: Self-pay | Admitting: Hematology and Oncology

## 2022-07-02 ENCOUNTER — Ambulatory Visit
Admission: RE | Admit: 2022-07-02 | Discharge: 2022-07-02 | Disposition: A | Discharge status: Home / Self Care | Payer: Self-pay | Source: Ambulatory Visit | Attending: Obstetrics and Gynecology | Admitting: Obstetrics and Gynecology

## 2022-07-02 ENCOUNTER — Ambulatory Visit
Admission: RE | Admit: 2022-07-02 | Discharge: 2022-07-02 | Disposition: A | Discharge status: Home / Self Care | Payer: No Typology Code available for payment source | Source: Ambulatory Visit | Attending: Obstetrics and Gynecology | Admitting: Obstetrics and Gynecology

## 2022-07-02 VITALS — BP 156/106 | Wt 285.0 lb

## 2022-07-02 DIAGNOSIS — N6012 Diffuse cystic mastopathy of left breast: Secondary | ICD-10-CM

## 2022-07-02 NOTE — Progress Notes (Signed)
Ms. NYIMA KNAGGS is a 42 y.o. female who presents to [facility] clinic today with complaint of bilateral outer quadrant breast pain.    Pap Smear: Pap not smear completed today. Last Pap smear was 04/19/2020 and was normal. Per patient has no history of an abnormal Pap smear. Last Pap smear result is available in Epic.   Physical exam: Breasts Breasts symmetrical. No skin abnormalities bilateral breasts. No nipple retraction bilateral breasts. No nipple discharge bilateral breasts. No lymphadenopathy. No lumps palpated bilateral breasts.   MM DIAG BREAST TOMO BILATERAL  Result Date: 06/19/2021 CLINICAL DATA:  One year follow-up for probably benign mass in the LEFT breast. During exam today, patient comments on fullness in the LEFT axilla, sometimes tender to the touch. EXAM: DIGITAL DIAGNOSTIC BILATERAL MAMMOGRAM WITH TOMOSYNTHESIS AND CAD; ULTRASOUND LEFT BREAST LIMITED TECHNIQUE: Bilateral digital diagnostic mammography and breast tomosynthesis was performed. The images were evaluated with computer-aided detection.; Targeted ultrasound examination of the left breast was performed. COMPARISON:  Previous exam(s). ACR Breast Density Category b: There are scattered areas of fibroglandular density. FINDINGS: RIGHT breast is negative. In the UPPER OUTER QUADRANT of the LEFT breast, a stable circumscribed isoechoic mass is identified, similar in appearance to prior studies. No new or suspicious findings in the LEFT breast. On physical exam, within the UPPER LEFT axilla/proximal arm region, I palpate slight fullness when the patient is arm is extended. Targeted ultrasound is performed, showing a circumscribed group of microcysts in the 2 o'clock location of the LEFT breast 5 centimeters from the nipple, measuring 0.9 x 0.4 x 1.0 centimeters. Ultrasound of the LEFT axilla shows normal axillary contents superficial to the humeral head in the area of concern. Evaluation of the LOWER axilla shows normal appearing lymph  nodes. IMPRESSION: No mammographic or ultrasound evidence for malignancy. Stable appearance of probable fibrocystic changes in the 2 o'clock location of the LEFT breast. RECOMMENDATION: Recommend bilateral diagnostic mammogram with LEFT breast ultrasound in 1 year to complete follow-up. I have discussed the findings and recommendations with the patient. If applicable, a reminder letter will be sent to the patient regarding the next appointment. BI-RADS CATEGORY  3: Probably benign. Electronically Signed   By: Norva Pavlov M.D.   On: 06/19/2021 10:39  MM 3D SCREEN BREAST BILATERAL  Result Date: 05/17/2020 CLINICAL DATA:  Screening. EXAM: DIGITAL SCREENING BILATERAL MAMMOGRAM WITH TOMOSYNTHESIS AND CAD TECHNIQUE: Bilateral screening digital craniocaudal and mediolateral oblique mammograms were obtained. Bilateral screening digital breast tomosynthesis was performed. The images were evaluated with computer-aided detection. COMPARISON:  None. ACR Breast Density Category b: There are scattered areas of fibroglandular density. FINDINGS: In the left breast, a possible mass warrants further evaluation. This possible mass is seen within the outer LEFT breast, cc slice 56 and MLO slice 64. In the right breast, no findings suspicious for malignancy. IMPRESSION: Further evaluation is suggested for possible mass in the left breast. RECOMMENDATION: Ultrasound of the left breast. (Code:US-L-13M) The patient will be contacted regarding the findings, and additional imaging will be scheduled. BI-RADS CATEGORY  0: Incomplete. Need additional imaging evaluation and/or prior mammograms for comparison. Electronically Signed   By: Bary Richard M.D.   On: 05/17/2020 15:37        Pelvic/Bimanual Pap is not indicated today    Smoking History: Patient has is a former smoker and was not referred to quit line.    Patient Navigation: Patient education provided. Access to services provided for patient through [facility] program. No  interpreter provided. No transportation  provided   Colorectal Cancer Screening: Per patient has never had colonoscopy completed No complaints today.    Breast and Cervical Cancer Risk Assessment: Patient does not have family history of breast cancer, known genetic mutations, or radiation treatment to the chest before age 52. Patient does not have history of cervical dysplasia, immunocompromised, or DES exposure in-utero.  Risk Scores as of 07/02/2022     Dondra Spry           5-year 0.67 %   Lifetime 9.98 %            Last calculated by Caprice Red, CMA on 07/02/2022 at 12:46 PM        A: BCCCP exam without pap smear Complaints of bilateral outer quadrant breast pain with normal exam.   P: Referred patient to the [facility] of [facility] for a diagnostic mammogram. Appointment scheduled 07/02/22.  Pascal Lux, NP 07/02/2022 1:06 PM

## 2022-07-02 NOTE — Patient Instructions (Signed)
Taught Brendia Sacks about self breast awareness and gave educational materials to take home. Patient did not need a Pap smear today due to last Pap smear was in 04/19/2020 per patient. Let her know BCCCP will cover Pap smears every 5 years unless has a history of abnormal Pap smears. Referred patient to the [facility] of [facility] for diagnostic mammogram. Appointment scheduled for 07/02/22. Patient aware of appointment and will be there. Let patient know will follow up with her within the next couple weeks with results. KANIJAH NEST verbalized understanding.  Pascal Lux, NP 12:44 PM

## 2022-07-29 ENCOUNTER — Ambulatory Visit: Payer: Self-pay | Admitting: Nurse Practitioner

## 2022-08-10 ENCOUNTER — Encounter: Payer: Self-pay | Admitting: Family Medicine

## 2022-08-10 ENCOUNTER — Ambulatory Visit (INDEPENDENT_AMBULATORY_CARE_PROVIDER_SITE_OTHER): Payer: Self-pay | Admitting: Family Medicine

## 2022-08-10 VITALS — BP 139/89 | HR 76 | Ht 70.8 in | Wt 284.8 lb

## 2022-08-10 DIAGNOSIS — I10 Essential (primary) hypertension: Secondary | ICD-10-CM

## 2022-08-10 DIAGNOSIS — F331 Major depressive disorder, recurrent, moderate: Secondary | ICD-10-CM

## 2022-08-10 MED ORDER — ARIPIPRAZOLE 2 MG PO TABS
2.0000 mg | ORAL_TABLET | Freq: Every day | ORAL | 0 refills | Status: DC
Start: 2022-08-24 — End: 2022-09-24

## 2022-08-10 NOTE — Patient Instructions (Signed)
It was nice to see you today,  We addressed the following topics today: - decrease your wellbutrin dose to 1/2 a tablet daily for two weeks - then take every other day for one week.   - after you finish tapering off, you can start aripiprazole - take your aripiprazole at 2mg  daily to start.  - I will follow up with you to discuss adjusting the dose.   - I will look into alternative resources for you to help with your mother.    Have a great day,  Frederic Jericho, MD

## 2022-08-10 NOTE — Progress Notes (Unsigned)
   Established Patient Office Visit  Subjective   Patient ID: Teresa Harper, female    DOB: [birth year]  Age: 42 y.o. MRN: [id number]  No chief complaint on file.   HPI  Mood - father passed away in 03/24/22.  Energy,.  Mix of both.  Mainly anxiety.     Still in fathers house. Take care of mom.    Caregiver fatigue. Stuck at home.    Mother ITP, changing IVIG.  Clots - strokes.   Able to communicate.  Sometimes mentally off.  100% dependent.    [facility]?     Wellbutrin added.  On lexapro.  On buspar as well? Not taking buspar in a few months.    Lexapro for a long time.  Since 2018.    Htn - on lisinopril had cough.  Wanted to be on something that wasn't teratogenic.     The ASCVD Risk score (Arnett DK, et al., 2019) failed to calculate for the following reasons:   Cannot find a previous HDL lab   Cannot find a previous total cholesterol lab   {History (Optional):23778}  ROS    Objective:     There were no vitals taken for this visit. {Vitals History (Optional):23777}  Physical Exam   No results found for any visits on 08/10/22.  {Labs (Optional):23779}      Assessment & Plan:   There are no diagnoses linked to this encounter.   No follow-ups on file.    Sandre Kitty, MD

## 2022-08-11 NOTE — Assessment & Plan Note (Signed)
Continue SSRI - Discussed weaning off of Wellbutrin with the patient.  Will do half tablet of her 150 daily for 2 weeks then every other day for 1 week. - At that time can switch to aripiprazole 2 mg. - Continue to look into resources available for caregiver support: Teresa Harper has resources for nonhospice patients now.

## 2022-08-11 NOTE — Assessment & Plan Note (Signed)
Continue labetalol as prescribed.  Is stable on this medication for several years.

## 2022-09-02 ENCOUNTER — Ambulatory Visit: Payer: No Typology Code available for payment source | Admitting: Family Medicine

## 2022-09-02 NOTE — Progress Notes (Deleted)
Established Patient Office Visit  Subjective   Patient ID: Teresa Harper, female    DOB: 01-02-1981  Age: 42 y.o. MRN: [id number]  No chief complaint on file.   HPI Mood-weaning off Wellbutrin?  Starting aripiprazole.  Caregiver resources.   The ASCVD Risk score (Arnett DK, et al., 2019) failed to calculate for the following reasons:   Cannot find a previous HDL lab   Cannot find a previous total cholesterol lab  Health Maintenance Due  Topic Date Due   HIV Screening  Never done   Hepatitis C Screening  Never done   DTaP/Tdap/Td (1 - Tdap) Never done   COVID-19 Vaccine (1 - 2023-24 season) Never done   INFLUENZA VACCINE  08/13/2022      Objective:     LMP 06/23/2022  {Vitals History (Optional):23777}  Physical Exam   No results found for any visits on 09/02/22.      Assessment & Plan:   There are no diagnoses linked to this encounter.   No follow-ups on file.    Sandre Kitty, MD

## 2022-09-15 ENCOUNTER — Encounter: Payer: Self-pay | Admitting: Family Medicine

## 2022-09-24 ENCOUNTER — Ambulatory Visit (INDEPENDENT_AMBULATORY_CARE_PROVIDER_SITE_OTHER): Payer: Self-pay | Admitting: Family Medicine

## 2022-09-24 ENCOUNTER — Encounter: Payer: Self-pay | Admitting: Family Medicine

## 2022-09-24 VITALS — BP 147/99 | HR 80 | Ht 70.0 in | Wt 287.1 lb

## 2022-09-24 DIAGNOSIS — Z23 Encounter for immunization: Secondary | ICD-10-CM

## 2022-09-24 DIAGNOSIS — E669 Obesity, unspecified: Secondary | ICD-10-CM

## 2022-09-24 DIAGNOSIS — I10 Essential (primary) hypertension: Secondary | ICD-10-CM

## 2022-09-24 DIAGNOSIS — R5383 Other fatigue: Secondary | ICD-10-CM

## 2022-09-24 DIAGNOSIS — N926 Irregular menstruation, unspecified: Secondary | ICD-10-CM

## 2022-09-24 DIAGNOSIS — F331 Major depressive disorder, recurrent, moderate: Secondary | ICD-10-CM

## 2022-09-24 MED ORDER — ARIPIPRAZOLE 2 MG PO TABS: 2.0000 mg | ORAL_TABLET | Freq: Two times a day (BID) | ORAL | 1 refills | Status: DC

## 2022-09-24 NOTE — Progress Notes (Signed)
Established Patient Office Visit  Subjective   Patient ID: Teresa Harper, female    DOB: [birth year]  Age: 42 y.o. MRN: [id number]  Chief Complaint  Patient presents with   Medical Management of Chronic Issues    HPI   HTN -patient was taking the labetalol twice a day.  She did not take her morning dose yet this morning.  She has a blood pressure machine at home but does not check it regularly.  We discussed checking it twice a day regularly and then discussing results with [place] the next time I see her in the office in 1 month.  Depression-patient is weaned off Wellbutrin and on Abilify.  Did not notice any difference with 2 mg dose so she started taking 2 mg twice a day.  Noticed that she is sleeping better with it because she is not thinking about so many things.  She has also started doing crafts such as Land.  This improves her mood.  Difficulty sleeping/fatigue-patient feels like she is tired during the day.  It started even before she changed to Abilify.  Has been told she snores but does not think she is ever been told she has apneic episodes.  Has rare morning headaches.  Fatigue is usually midday and afternoon.  She took a pregnancy test recently which was negative.   The ASCVD Risk score (Arnett DK, et al., 2019) failed to calculate for the following reasons:   Cannot find a previous HDL lab   Cannot find a previous total cholesterol lab  Health Maintenance Due  Topic Date Due   HIV Screening  Never done   Hepatitis C Screening  Never done   DTaP/Tdap/Td (1 - Tdap) Never done   COVID-19 Vaccine (1 - 2023-24 season) Never done      Objective:     BP (!) 147/99   Pulse 80   Ht 5\' 10"  (1.778 m)   Wt 287 lb 1.9 oz (130.2 kg)   SpO2 98%   BMI 41.20 kg/m    Physical Exam General: Alert, oriented.  Accompanied by her mother whom she is the main caregiver for Neck: Increased neck circumference CV: Regular rhythm Pulmonary: Clear  bilaterally Psych: Pleasant affect.   No results found for any visits on 09/24/22.      Assessment & Plan:   Obesity (BMI 30-39.9) -     TSH -     Lipid panel -     Hemoglobin A1c  Other fatigue Assessment & Plan: Most likely multifactorial from obesity, some degree of depression, possibly sleep apnea.  Will check CBC and thyroid.  Patient open to getting sleep study if blood test unrevealing.  Orders: -     CBC -     Comprehensive metabolic panel -     B12 and Folate Panel  Irregular menses Assessment & Plan: Has a history of irregular menses but for the past 5 years was more regular.  Has not had a menstrual cycle since June.  Took a home pregnancy test which was negative.  Would like to confirm with blood test here. - hCG quantitative  Orders: -     hCG, quantitative, pregnancy -     Beta hCG quant (ref lab)  Need for influenza vaccination -     Flu vaccine trivalent PF, 6mos and older(Flulaval,Afluria,Fluarix,Fluzone)  Essential hypertension Assessment & Plan: Elevated today.  Patient has labetalol at home that she takes twice a day.  Has not taken it  yet today. - Advised to check twice daily and report back to [place] results at her next visit.  Depending on those results can change or increase medication   Moderate episode of recurrent major depressive disorder (HCC) Assessment & Plan: Now on Abilify 2 mg twice daily.  Seems to be working well.  Sleeping better.  Started doing crafts again.  Will continue that dose for now and recheck in 4 weeks.  Will send in a [facility] as it is cheaper.      Return in about 4 weeks (around 10/22/2022) for Mood.    Sandre Kitty, MD

## 2022-09-24 NOTE — Assessment & Plan Note (Signed)
Most likely multifactorial from obesity, some degree of depression, possibly sleep apnea.  Will check CBC and thyroid.  Patient open to getting sleep study if blood test unrevealing.

## 2022-09-24 NOTE — Assessment & Plan Note (Signed)
Elevated today.  Patient has labetalol at home that she takes twice a day.  Has not taken it yet today. - Advised to check twice daily and report back to [place] results at her next visit.  Depending on those results can change or increase medication

## 2022-09-24 NOTE — Assessment & Plan Note (Signed)
Has a history of irregular menses but for the past 5 years was more regular.  Has not had a menstrual cycle since June.  Took a home pregnancy test which was negative.  Would like to confirm with blood test here. - hCG quantitative

## 2022-09-24 NOTE — Assessment & Plan Note (Signed)
Now on Abilify 2 mg twice daily.  Seems to be working well.  Sleeping better.  Started doing crafts again.  Will continue that dose for now and recheck in 4 weeks.  Will send in a [facility] as it is cheaper.

## 2022-09-24 NOTE — Patient Instructions (Addendum)
It was nice to see you today,  We addressed the following topics today: -I would like you to keep taking the Abilify at your current dose of 2 mg twice a day.  We will give this another month before we decide to change it.  You can always do a virtual visit with me if that is all we are discussing. - Continue doing the other things that help with your mood such as your arts and crafts. - I will send in a referral if I can do [facility] regarding the guided program for dementia.  You can read more about it on this website https://www.[facility].org/what-is-palliative-care/illness-specific/alzheimers-and-dementia-patients - I will do some blood test to check for causes of your fatigue.  Depending on those results I will then order a sleep study if needed - I will order a pregnancy test to confirm your negative test at home.  Have a great day,  Frederic Jericho, MD

## 2022-09-25 ENCOUNTER — Other Ambulatory Visit: Payer: Self-pay | Admitting: Nurse Practitioner

## 2022-09-25 ENCOUNTER — Other Ambulatory Visit: Payer: Self-pay | Admitting: Family Medicine

## 2022-09-25 DIAGNOSIS — F411 Generalized anxiety disorder: Secondary | ICD-10-CM

## 2022-09-25 LAB — CBC
Hematocrit: 43.5 % (ref 34.0–46.6)
Hemoglobin: 14.1 g/dL (ref 11.1–15.9)
MCH: 29.1 pg (ref 26.6–33.0)
MCHC: 32.4 g/dL (ref 31.5–35.7)
MCV: 90 fL (ref 79–97)
Platelets: 298 10*3/uL (ref 150–450)
RBC: 4.85 x10E6/uL (ref 3.77–5.28)
RDW: 12.7 % (ref 11.7–15.4)
WBC: 10.5 10*3/uL (ref 3.4–10.8)

## 2022-09-25 LAB — COMPREHENSIVE METABOLIC PANEL
ALT: 39 IU/L — ABNORMAL HIGH (ref 0–32)
AST: 32 IU/L (ref 0–40)
Albumin: 4.7 g/dL (ref 3.9–4.9)
Alkaline Phosphatase: 68 IU/L (ref 44–121)
BUN/Creatinine Ratio: 14 (ref 9–23)
BUN: 11 mg/dL (ref 6–24)
Bilirubin Total: 0.7 mg/dL (ref 0.0–1.2)
CO2: 23 mmol/L (ref 20–29)
Calcium: 9.5 mg/dL (ref 8.7–10.2)
Chloride: 100 mmol/L (ref 96–106)
Creatinine, Ser: 0.8 mg/dL (ref 0.57–1.00)
Globulin, Total: 2.4 g/dL (ref 1.5–4.5)
Glucose: 99 mg/dL (ref 70–99)
Potassium: 3.9 mmol/L (ref 3.5–5.2)
Sodium: 140 mmol/L (ref 134–144)
Total Protein: 7.1 g/dL (ref 6.0–8.5)
eGFR: 94 mL/min/{1.73_m2} (ref >59)

## 2022-09-25 LAB — LIPID PANEL
Chol/HDL Ratio: 4 ratio (ref 0.0–4.4)
Cholesterol, Total: 192 mg/dL (ref 100–199)
HDL: 48 mg/dL (ref >39)
LDL Chol Calc (NIH): 116 mg/dL — ABNORMAL HIGH (ref 0–99)
Triglycerides: 156 mg/dL — ABNORMAL HIGH (ref 0–149)
VLDL Cholesterol Cal: 28 mg/dL (ref 5–40)

## 2022-09-25 LAB — TSH: TSH: 0.808 u[IU]/mL (ref 0.450–4.500)

## 2022-09-25 LAB — HEMOGLOBIN A1C
Est. average glucose Bld gHb Est-mCnc: 123 mg/dL
Hgb A1c MFr Bld: 5.9 % — ABNORMAL HIGH (ref 4.8–5.6)

## 2022-09-25 LAB — B12 AND FOLATE PANEL
Folate: >20 ng/mL (ref >3.0)
Vitamin B-12: 871 pg/mL (ref 232–1245)

## 2022-09-25 LAB — BETA HCG QUANT (REF LAB): hCG Quant: <1 m[IU]/mL

## 2022-09-25 MED ORDER — ESCITALOPRAM OXALATE 20 MG PO TABS: 20.0000 mg | ORAL_TABLET | Freq: Every day | ORAL | 3 refills | Status: DC

## 2022-09-25 NOTE — Telephone Encounter (Signed)
Escitalopram refill sent in

## 2022-10-22 ENCOUNTER — Ambulatory Visit: Payer: Self-pay | Admitting: Family Medicine

## 2022-11-17 ENCOUNTER — Encounter: Payer: Self-pay | Admitting: Family Medicine

## 2022-11-17 ENCOUNTER — Ambulatory Visit (INDEPENDENT_AMBULATORY_CARE_PROVIDER_SITE_OTHER): Payer: Self-pay | Admitting: Family Medicine

## 2022-11-17 VITALS — BP 128/84 | HR 68 | Ht 70.0 in | Wt 289.4 lb

## 2022-11-17 DIAGNOSIS — G5603 Carpal tunnel syndrome, bilateral upper limbs: Secondary | ICD-10-CM | POA: Insufficient documentation

## 2022-11-17 DIAGNOSIS — F331 Major depressive disorder, recurrent, moderate: Secondary | ICD-10-CM

## 2022-11-17 DIAGNOSIS — I10 Essential (primary) hypertension: Secondary | ICD-10-CM

## 2022-11-17 MED ORDER — LABETALOL HCL 300 MG PO TABS: 300.0000 mg | ORAL_TABLET | Freq: Two times a day (BID) | ORAL | 3 refills | Status: DC

## 2022-11-17 NOTE — Assessment & Plan Note (Signed)
Increasing labetalol to 300 milligrams twice daily.  Advised patient to check blood pressure at home, follow-up at next visit.

## 2022-11-17 NOTE — Patient Instructions (Signed)
It was nice to see you today,  We addressed the following topics today: -I have increased your labetalol to 300 twice daily. - For carpal tunnel, this may be due to the pressure primary hands when riding her motorcycle.  Or could be from the gloves.  You can use nocturnal wrist splints at night.  These are available at pharmacies over-the-counter.  These are splints that have a hard rigid frame that keep your wrist in a neutral position - You can follow-up with me in 2 months  Have a great day,  Frederic Jericho, MD

## 2022-11-17 NOTE — Assessment & Plan Note (Signed)
Recommended nocturnal splinting.  If it is not effective can refer to orthopedics for further treatment options.  Recommended wearing better fitting gloves.  Discussed how pressure applied to the wrist when riding a motorcycle or bike can contribute to symptoms.

## 2022-11-17 NOTE — Progress Notes (Signed)
   Established Patient Office Visit  Subjective   Patient ID: Teresa Harper, female    DOB: [birth year]  Age: 42 y.o. MRN: [id number]  Chief Complaint  Patient presents with   Medical Management of Chronic Issues    HPI  With everything going on with the patient's mother being in the hospital recently, she was unable to check your home blood pressure readings.  She does state that they have been running higher recently.  We talked about increasing her labetalol dose.  Patient agreeable to this  Depression-patient still taking Abilify.  Feels like it is still working well for her.  Does not want to change the dose at this time.  Still feels like it is helping her get to sleep more easily.  Patient complaining of numbness in her bilateral index finger and thumb after recently taking a motorcycle learning class.  Describes it as alternating between numbness and paresthesia.  Has never had this issue before.  She attributed to the gloves being too tight.  We discussed carpal tunnel syndrome.  Discussed treatment options ranging from splints to injection to carpal tunnel release.    The 10-year ASCVD risk score (Arnett DK, et al., 2019) is: 1%  Health Maintenance Due  Topic Date Due   HIV Screening  Never done   Hepatitis C Screening  Never done   DTaP/Tdap/Td (1 - Tdap) Never done   COVID-19 Vaccine (1 - 2023-24 season) Never done      Objective:     BP 128/84   Pulse 68   Ht 5\' 10"  (1.778 m)   Wt 289 lb 6.4 oz (131.3 kg)   SpO2 99%   BMI 41.52 kg/m    Physical Exam General: Alert, oriented Extremities: Mild tenderness to palpation of the right CMC joint.  Negative Tinel and Phalen test   No results found for any visits on 11/17/22.      Assessment & Plan:   Moderate episode of recurrent major depressive disorder (HCC) Assessment & Plan: Continue with current dose of Abilify   Essential hypertension Assessment & Plan: Increasing labetalol to 300 milligrams  twice daily.  Advised patient to check blood pressure at home, follow-up at next visit.  Orders: -     Labetalol HCl; Take 1 tablet (300 mg total) by mouth 2 (two) times daily.  Dispense: 60 tablet; Refill: 3  Bilateral carpal tunnel syndrome Assessment & Plan: Recommended nocturnal splinting.  If it is not effective can refer to orthopedics for further treatment options.  Recommended wearing better fitting gloves.  Discussed how pressure applied to the wrist when riding a motorcycle or bike can contribute to symptoms.      Return in about 2 months (around 01/17/2023) for Hypertension, mood.    Sandre Kitty, MD

## 2022-11-17 NOTE — Assessment & Plan Note (Signed)
Continue with current dose of Abilify

## 2022-11-26 ENCOUNTER — Encounter: Payer: Self-pay | Admitting: Family Medicine

## 2023-01-19 ENCOUNTER — Ambulatory Visit: Payer: Self-pay | Admitting: Family Medicine

## 2023-02-15 ENCOUNTER — Encounter: Payer: Self-pay | Admitting: Family Medicine

## 2023-02-15 ENCOUNTER — Ambulatory Visit (INDEPENDENT_AMBULATORY_CARE_PROVIDER_SITE_OTHER): Payer: Self-pay | Admitting: Family Medicine

## 2023-02-15 VITALS — BP 147/98 | HR 79 | Ht 70.0 in | Wt 289.0 lb

## 2023-02-15 DIAGNOSIS — I10 Essential (primary) hypertension: Secondary | ICD-10-CM

## 2023-02-15 DIAGNOSIS — F411 Generalized anxiety disorder: Secondary | ICD-10-CM

## 2023-02-15 DIAGNOSIS — F331 Major depressive disorder, recurrent, moderate: Secondary | ICD-10-CM

## 2023-02-15 MED ORDER — ARIPIPRAZOLE 2 MG PO TABS: 2.0000 mg | ORAL_TABLET | Freq: Every day | ORAL | Status: DC

## 2023-02-15 MED ORDER — ARIPIPRAZOLE 5 MG PO TABS: 5.0000 mg | ORAL_TABLET | Freq: Every day | ORAL | 2 refills | Status: DC

## 2023-02-15 NOTE — Patient Instructions (Signed)
It was nice to see you today,  We addressed the following topics today: -I have sent in a prescription for 5 mg Abilify.  Take this plus a 2 mg tablet of Abilify to make 7 mg total. - Continue taking labetalol. - I will follow-up with you in 1 month for a virtual visit  Have a great day,  Frederic Jericho, MD

## 2023-02-15 NOTE — Progress Notes (Unsigned)
   Established Patient Office Visit  Subjective   Patient ID: Teresa Harper, female    DOB: 21-Aug-1980  Age: 43 y.o. MRN: [id number]  Chief Complaint  Patient presents with   Medical Management of Chronic Issues    HPI  HTN -patient still taking her labetalol twice daily.  Has been out of it for 2 days which is why she states her blood pressure is higher today.  Otherwise tolerating it fine.  Has to go pick some up on the way home today.  Anxiety/depression-patient taking 4 mg Abilify.  Has noticed some worsening sleep.  Has noticed increased anxiety in social situations, and overall bulimic the medication is not as effective as it was initially.  Discussed increasing the dose to 7 mg by adding a 5 mg tablet.   The 10-year ASCVD risk score (Arnett DK, et al., 2019) is: 1.4%  Health Maintenance Due  Topic Date Due   HIV Screening  Never done   Hepatitis C Screening  Never done   DTaP/Tdap/Td (1 - Tdap) Never done   COVID-19 Vaccine (1 - 2024-25 season) Never done      Objective:     BP (!) 147/98   Pulse 79   Ht 5\' 10"  (1.778 m)   Wt 289 lb (131.1 kg)   SpO2 99%   BMI 41.47 kg/m  {Vitals History (Optional):23777}  Physical Exam General: Alert, oriented Pulmonary: No respiratory distress Psych: Pleasant affect   No results found for any visits on 02/15/23.      Assessment & Plan:   Moderate episode of recurrent major depressive disorder (HCC) -     ARIPiprazole; Take 1 tablet (2 mg total) by mouth daily. Combine with 5 mg to make 7 mg total  Other orders -     ARIPiprazole; Take 1 tablet (5 mg total) by mouth daily.  Dispense: 30 tablet; Refill: 2     Return in about 4 weeks (around 03/15/2023) for Anxiety.    Sandre Kitty, MD

## 2023-02-17 NOTE — Assessment & Plan Note (Signed)
 Patient has been out of her medication for several days.  Blood pressure is elevated today.  No changes to medication due to the fact that she has not been taking it recently.  Plans to pick it up today.  Recheck at her next visit.

## 2023-02-17 NOTE — Assessment & Plan Note (Addendum)
 Patient feeling like she is not adequately controlled with current dose.  She is still taking Lexapro .  Currently taking 4 mg of Abilify .  Will increase to 7 mg by adding 5 mg and continuing the 2 mg tablets of her current medication.  Follow-up in 1 month

## 2023-03-22 NOTE — Progress Notes (Unsigned)
   Established Patient Office Visit  Subjective   Patient ID: Teresa Harper, female    DOB: [birth year]  Age: 43 y.o. MRN: [id number]  No chief complaint on file.   HPI  Abilify - 7mg     The 10-year ASCVD risk score (Arnett DK, et al., 2019) is: 1.5%  Health Maintenance Due  Topic Date Due   HIV Screening  Never done   Hepatitis C Screening  Never done   DTaP/Tdap/Td (1 - Tdap) Never done   COVID-19 Vaccine (1 - 2024-25 season) Never done      Objective:     There were no vitals taken for this visit. {Vitals History (Optional):23777}  Physical Exam   No results found for any visits on 03/23/23.      Assessment & Plan:   There are no diagnoses linked to this encounter.   No follow-ups on file.    Sandre Kitty, MD

## 2023-03-23 ENCOUNTER — Encounter: Payer: Self-pay | Admitting: Family Medicine

## 2023-03-23 ENCOUNTER — Telehealth: Payer: Self-pay | Admitting: Family Medicine

## 2023-03-23 VITALS — Ht 70.0 in | Wt 289.0 lb

## 2023-03-23 DIAGNOSIS — G245 Blepharospasm: Secondary | ICD-10-CM

## 2023-03-23 DIAGNOSIS — F411 Generalized anxiety disorder: Secondary | ICD-10-CM

## 2023-03-23 NOTE — Assessment & Plan Note (Signed)
 Can continue with escitalopram and Abilify.  No changes.  She has not taken BuSpar in a while.  Follow-up in 3 months.

## 2023-03-23 NOTE — Assessment & Plan Note (Signed)
 Has been present for the past month.  Has happened before but generally does not last this long.  Possibly related to medication, but she is on a low-dose of Abilify (less than 10 mg daily).  Recommended patient try magnesium oxide 400 mg daily.  If she continues to have these issues we can check calcium, electrolyte levels, vitamin D.

## 2023-04-21 ENCOUNTER — Encounter: Payer: Self-pay | Admitting: Family Medicine

## 2023-04-22 ENCOUNTER — Other Ambulatory Visit: Payer: Self-pay | Admitting: Family Medicine

## 2023-04-22 DIAGNOSIS — F331 Major depressive disorder, recurrent, moderate: Secondary | ICD-10-CM

## 2023-04-22 DIAGNOSIS — I10 Essential (primary) hypertension: Secondary | ICD-10-CM

## 2023-04-22 MED ORDER — LABETALOL HCL 300 MG PO TABS: 300.0000 mg | ORAL_TABLET | Freq: Two times a day (BID) | ORAL | 1 refills | Status: DC

## 2023-04-22 MED ORDER — ARIPIPRAZOLE 2 MG PO TABS: 2.0000 mg | ORAL_TABLET | Freq: Every day | ORAL | 1 refills | Status: AC

## 2023-04-22 MED ORDER — ARIPIPRAZOLE 5 MG PO TABS: 5.0000 mg | ORAL_TABLET | Freq: Every day | ORAL | 1 refills | Status: AC

## 2023-08-13 IMAGING — MG DIGITAL DIAGNOSTIC BILAT W/ TOMO W/ CAD
8 series · 8 of 24 positions shown · non-contrast
Comparison: Previous exam(s).

CLINICAL DATA: One year follow-up for probably benign mass in the
LEFT breast. During exam today, patient comments on fullness in the
LEFT axilla, sometimes tender to the touch.

EXAM:
DIGITAL DIAGNOSTIC BILATERAL MAMMOGRAM WITH TOMOSYNTHESIS AND CAD;
ULTRASOUND LEFT BREAST LIMITED
TECHNIQUE: Bilateral digital diagnostic mammography and breast tomosynthesis
was performed. The images were evaluated with computer-aided
detection.; Targeted ultrasound examination of the left breast was
performed.

[L MLO synth-2D]
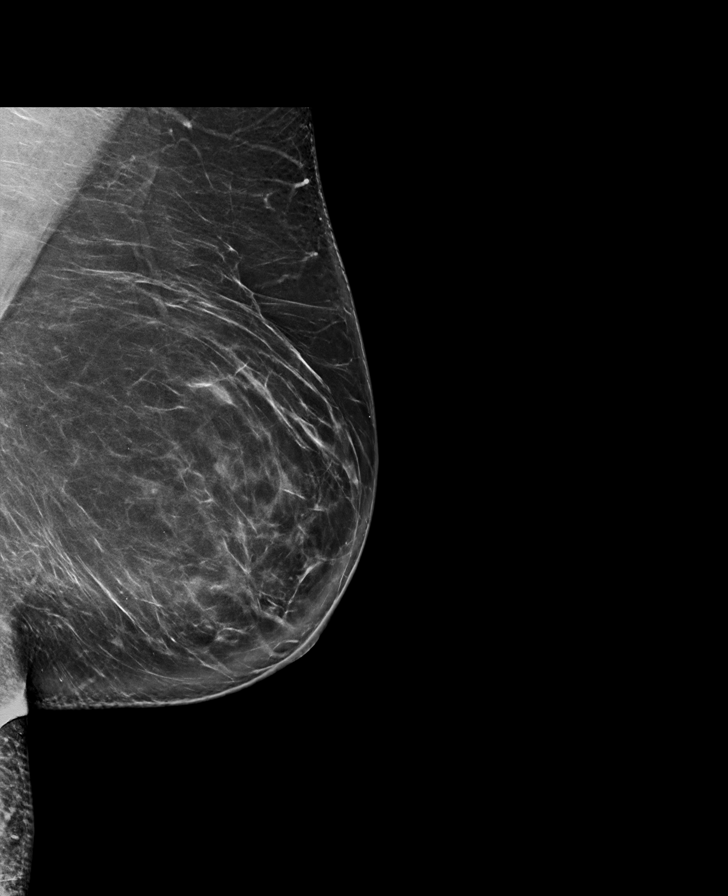

[R CC synth-2D]
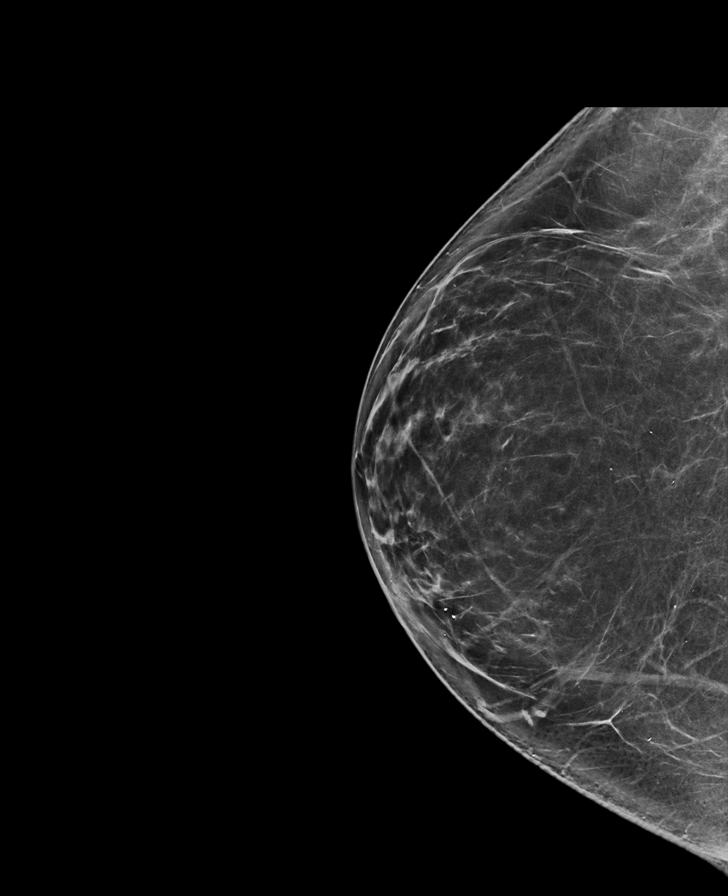

[R MLO synth-2D]
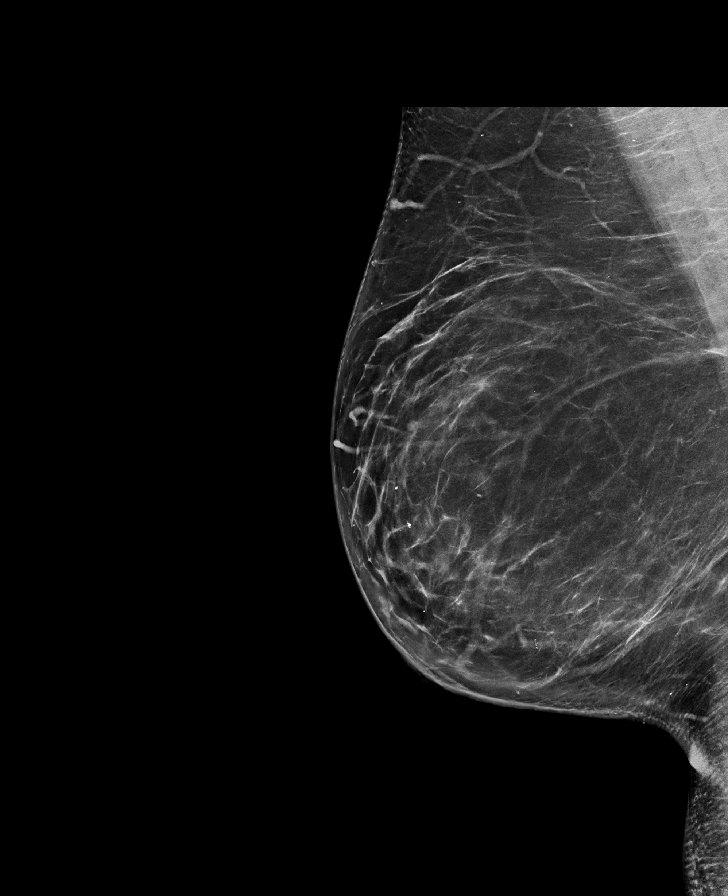

[L CC synth-2D]
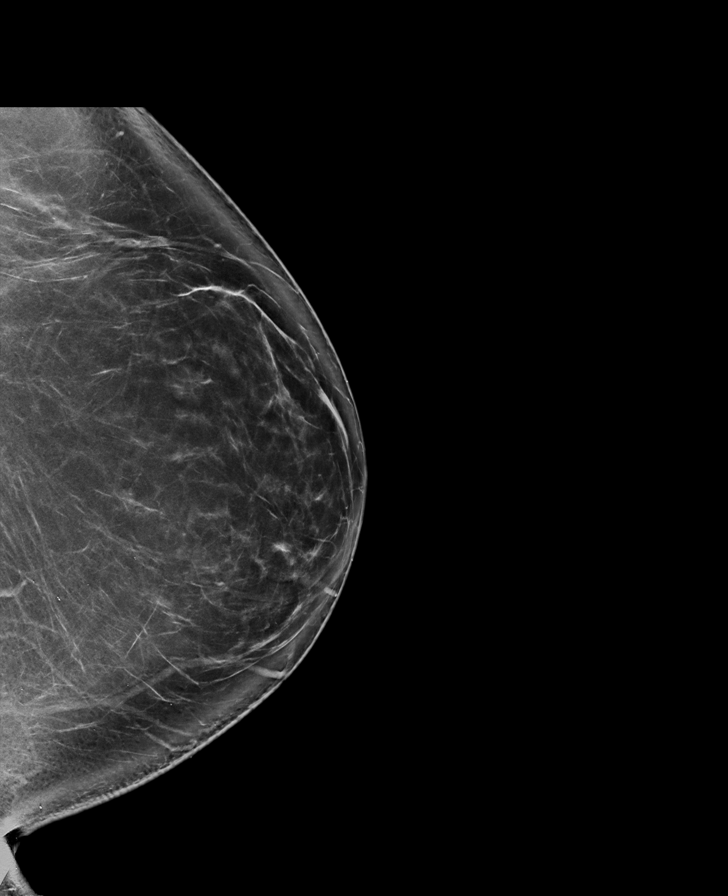

[R MLO tomo · tomo slice 47/92.0]
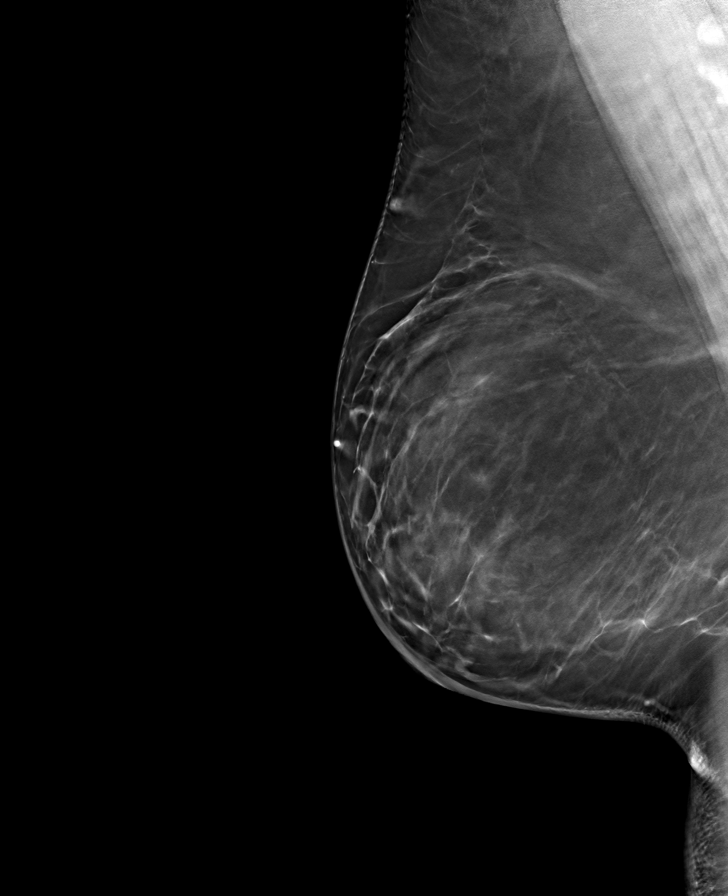

[L MLO tomo · tomo slice 49/97.0]
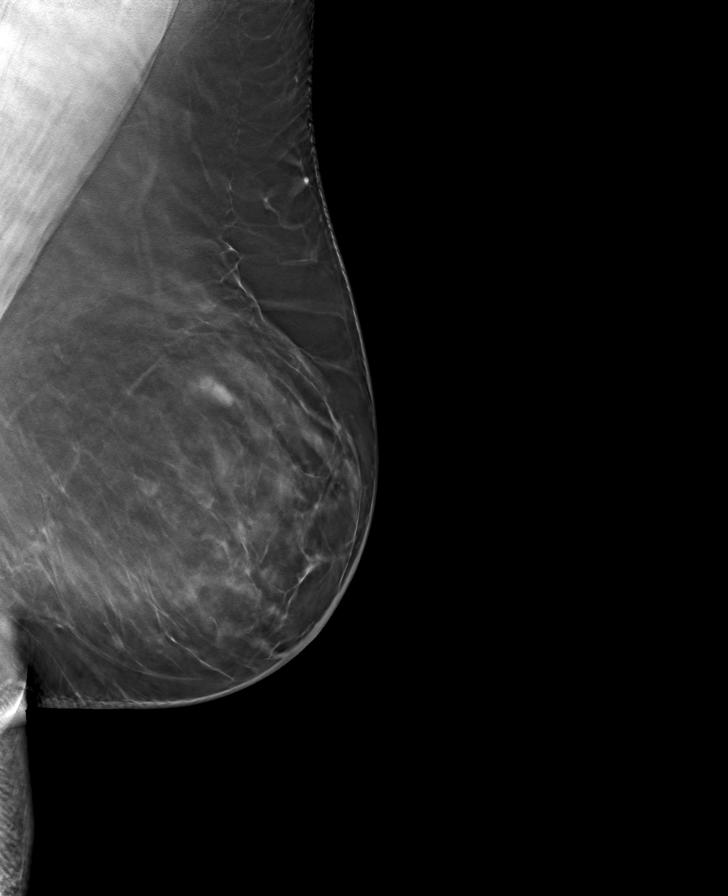

[R CC tomo · tomo slice 46/91.0]
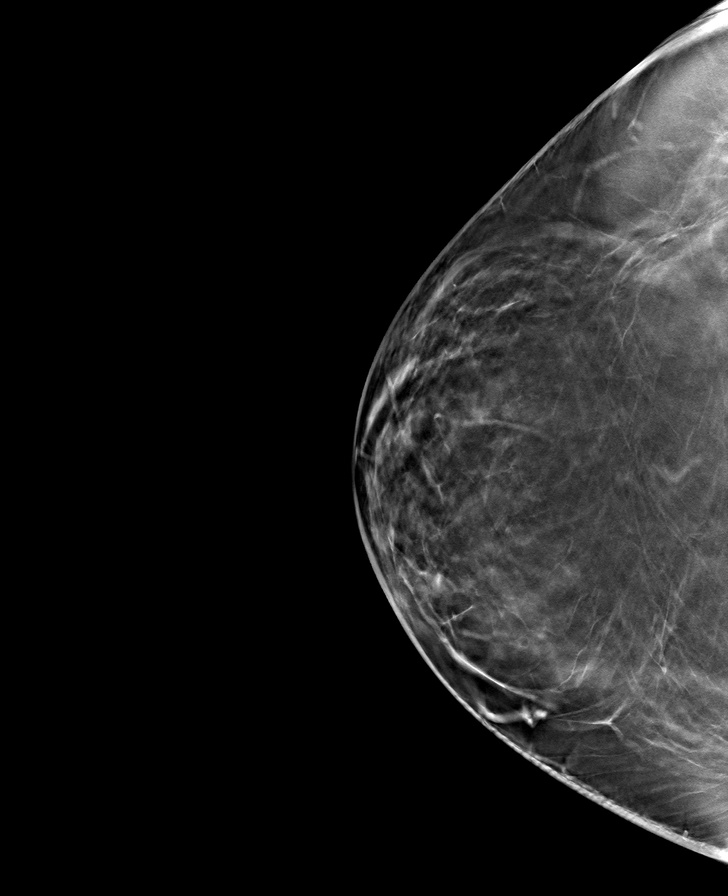

[L CC tomo · tomo slice 49/98.0]
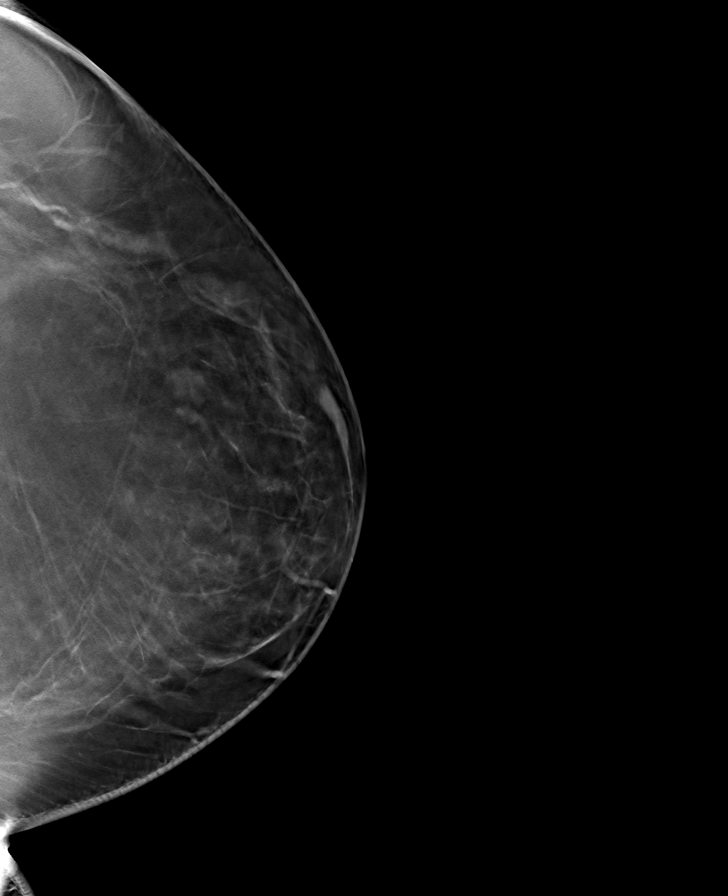

[8 of 24 positions shown; findings below may reference images not displayed]

ACR Breast Density Category b: There are scattered areas of
fibroglandular density.
FINDINGS: RIGHT breast is negative. In the UPPER OUTER QUADRANT of the LEFT
breast, a stable circumscribed isoechoic mass is identified, similar
in appearance to prior studies. No new or suspicious findings in the
LEFT breast.

On physical exam, within the UPPER LEFT axilla/proximal arm region,
I palpate slight fullness when the patient is arm is extended.

Targeted ultrasound is performed, showing a circumscribed group of
microcysts in the 2 o'clock location of the LEFT breast 5
centimeters from the nipple, measuring 0.9 x 0.4 x 1.0 centimeters.

Ultrasound of the LEFT axilla shows normal axillary contents
superficial to the humeral head in the area of concern. Evaluation
of the LOWER axilla shows normal appearing lymph nodes.
IMPRESSION: No mammographic or ultrasound evidence for malignancy.

Stable appearance of probable fibrocystic changes in the 2 o'clock
location of the LEFT breast.

RECOMMENDATION:
Recommend bilateral diagnostic mammogram with LEFT breast ultrasound
in 1 year to complete follow-up.

I have discussed the findings and recommendations with the patient.
If applicable, a reminder letter will be sent to the patient
regarding the next appointment.

BI-RADS CATEGORY  3: Probably benign.

## 2023-12-20 ENCOUNTER — Other Ambulatory Visit: Payer: Self-pay | Admitting: Family Medicine

## 2023-12-20 DIAGNOSIS — F411 Generalized anxiety disorder: Secondary | ICD-10-CM

## 2023-12-20 NOTE — Telephone Encounter (Signed)
 Pt is now scheduled for 12/22/23

## 2023-12-22 ENCOUNTER — Ambulatory Visit: Payer: Self-pay | Admitting: Family Medicine

## 2023-12-22 ENCOUNTER — Encounter: Payer: Self-pay | Admitting: Family Medicine

## 2023-12-22 DIAGNOSIS — F411 Generalized anxiety disorder: Secondary | ICD-10-CM

## 2023-12-22 NOTE — Patient Instructions (Signed)
 It was nice to see you today,  We addressed the following topics today: - I have sent a referral to [facility] in [place] for your shoulder. They will also be able to show you exercises for the dizziness.They will call you to schedule and discuss payment options as a self-pay patient. - If physical therapy is not feasible, please try the home shoulder exercises I am providing. - For the shoulder pain, you can use rest, ice/heat, and over-the-counter pain relievers like Tylenol, ibuprofen, or Voltaren gel. - To reduce your Abilify , you can start taking only the 5mg  tablet. Hold on to the 2mg  tablets in case you want to taper down further in the future. - I would be cautious about letting the chiropractor perform any quick or forceful movements on your shoulder. Massage should be fine. - Please let us  know if the dizziness or shoulder pain gets worse or does not improve.  Have a great day,  Rolan Slain, MD

## 2023-12-22 NOTE — Progress Notes (Unsigned)
 Established Patient Office Visit  Subjective   Patient ID: Teresa Harper, female    DOB: Jun 11, 1980  Age: 43 y.o. MRN: [id number]  Chief Complaint  Patient presents with   Medical Management of Chronic Issues    HPI  Subjective - Reports new onset left shoulder pain for ~1 month. Dull, aching pain that is on and off. Pain occurs with the arm hanging at the side and with certain movements, such as lifting the arm and placing it behind the back. No specific injury recalled. Has tried stretching, repositioning, and one session of massage therapy. Reports no numbness or tingling in the arm or fingers. Wonders if it is related to lifting their mother.  - Reports intermittent dizziness for the past 1-2 weeks, described as episodes lasting 1-2 seconds, sometimes in multiples. Describes a sensation of the room moving, sometimes triggered by turning the head quickly. It has not occurred in the past couple of days. Reports a similar episode a few months ago with room spinning on lying down, which resolved spontaneously. Reports a history of similar dizziness in 2020, for which meclizine  was prescribed in the ED. No associated hearing changes or nausea. Denies presyncopal symptoms.  Medications Current medications include labetalol , Lexapro , and Abilify . Refill for Lexapro  was recently processed. Patient is currently taking Abilify  7mg  (one 5mg  and one 2mg  tablet). Wishes to decrease the dose.  PMH, PSH, FH, Social Hx PMHx: History of vertigo/dizziness, presenting to ED in 2020. 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  ROS HEENT: Positive for intermittent vertigo/dizziness. Negative for hearing changes. MSK: Positive for left shoulder pain. Negative for numbness or tingling in the left upper extremity. Neuro: Positive for dizziness. Negative for syncope/presyncope. GI: Negative for nausea.  Objective MSK: Left Shoulder: Tenderness to palpation over the anterior  aspect. Pain with forward flexion and internal rotation (hand behind back). Pain with resisted external rotation. Strength is limited by pain but no evidence of a full-thickness tear.  Assessment and Plan 1. 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 Examination findings are consistent with rotator cuff inflammation rather than a complete tear. - Refer to Physical Therapy ([facility]). - Home exercises will be provided. - Recommended rest, topical analgesics (e.g., Voltaren gel, Icy Hot), and PRN Tylenol or NSAIDs (ibuprofen). - Counseled on proper lifting biomechanics. - Advised that massage is likely safe, but to be cautious with chiropractic manipulation involving forceful movements of the shoulder.  2. 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 - Reassurance that this condition is benign and often self-resolves. - Discussed the Epley maneuver as a treatment but recommended it be performed under supervision. - Plan for physical therapy to also instruct on canalith repositioning maneuvers (Epley) for the vertigo. - Follow up if symptoms worsen or do not resolve.  3. Medication Management Patient is stable on current psychotropic medications but wishes to taper Abilify . - Plan to decrease Abilify  from 7mg  daily to 5mg  daily. - If tolerated after 2+ weeks, may further decrease to 2mg  daily, and then discontinue if desired. - Continue Lexapro  and labetalol  as prescribed.       The ASCVD Risk score (Arnett DK, et al., 2019) failed to calculate for the  following reasons:   The systolic blood pressure is missing  Health  Maintenance Due  Topic Date Due   HIV Screening  Never done   Hepatitis C Screening  Never done   DTaP/Tdap/Td (1 - Tdap) Never done   Hepatitis B Vaccines 19-59 Average Risk (1 of 3 - 19+ 3-dose series) Never done   HPV VACCINES (1 - 3-dose SCDM series) Never done   Influenza Vaccine  08/13/2023   COVID-19 Vaccine (1 - 2025-26 season) Never done      Objective:     There were no vitals taken for this visit. {Vitals History (Optional):23777}  Physical Exam   No results found for any visits on 12/22/23.      Assessment & Plan:   There are no diagnoses linked to this encounter.   No follow-ups on file.    Toribio MARLA Slain, MD

## 2023-12-23 ENCOUNTER — Other Ambulatory Visit: Payer: Self-pay | Admitting: Family Medicine

## 2023-12-23 DIAGNOSIS — I10 Essential (primary) hypertension: Secondary | ICD-10-CM

## 2023-12-25 DIAGNOSIS — H8110 Benign paroxysmal vertigo, unspecified ear: Secondary | ICD-10-CM | POA: Insufficient documentation

## 2023-12-25 DIAGNOSIS — S46002A Unspecified injury of muscle(s) and tendon(s) of the rotator cuff of left shoulder, initial encounter: Secondary | ICD-10-CM | POA: Insufficient documentation

## 2023-12-25 NOTE — Assessment & Plan Note (Signed)
 Intermittent, brief episodes of vertigo for 1-2 weeks, triggered by head movements, without other neurological symptoms. 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 - Reassurance that this condition is benign and often self-resolves. - Discussed the Epley maneuver as a treatment but recommended it be performed under supervision. - Plan for physical therapy to also instruct on canalith repositioning maneuvers (Epley) for the vertigo. - Follow up if symptoms worsen or do not resolve.

## 2023-12-25 NOTE — Assessment & Plan Note (Signed)
 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 Examination findings are consistent with rotator cuff inflammation rather than a complete tear. - Refer to Physical Therapy ([facility]). - Recommended rest, topical analgesics (e.g., Voltaren gel, Icy Hot), and PRN Tylenol or NSAIDs (ibuprofen). - Counseled on proper lifting biomechanics. - Advised that massage is likely safe, but to be cautious with chiropractic manipulation involving forceful movements of the shoulder.

## 2023-12-25 NOTE — Assessment & Plan Note (Signed)
 Patient is stable on current psychotropic medications but wishes to taper Abilify . - Plan to decrease Abilify  from 7mg  daily to 5mg  daily. - If tolerated after 2+ weeks, may further decrease to 2mg  daily, and then discontinue if desired. - Continue Lexapro 

## 2024-06-21 ENCOUNTER — Encounter: Payer: Self-pay | Admitting: Family Medicine
